# Patient Record
Sex: Female | Born: 1995 | Race: White | Hispanic: No | Marital: Single | Smoking: Current every day smoker
Health system: Southern US, Community
[De-identification: ages and names within clinical notes are randomized; demographics above are authoritative.]

## PROBLEM LIST (undated history)

## (undated) DIAGNOSIS — G43909 Migraine, unspecified, not intractable, without status migrainosus: Secondary | ICD-10-CM

## (undated) DIAGNOSIS — F329 Major depressive disorder, single episode, unspecified: Secondary | ICD-10-CM

## (undated) DIAGNOSIS — D849 Immunodeficiency, unspecified: Secondary | ICD-10-CM

## (undated) DIAGNOSIS — F32A Depression, unspecified: Secondary | ICD-10-CM

## (undated) DIAGNOSIS — F909 Attention-deficit hyperactivity disorder, unspecified type: Secondary | ICD-10-CM

## (undated) DIAGNOSIS — J45909 Unspecified asthma, uncomplicated: Secondary | ICD-10-CM

## (undated) HISTORY — DX: Depression, unspecified: F32.A

## (undated) HISTORY — PX: MYRINGOTOMY: SUR874

## (undated) HISTORY — DX: Immunodeficiency, unspecified: D84.9

## (undated) HISTORY — DX: Migraine, unspecified, not intractable, without status migrainosus: G43.909

## (undated) HISTORY — DX: Major depressive disorder, single episode, unspecified: F32.9

## (undated) HISTORY — DX: Attention-deficit hyperactivity disorder, unspecified type: F90.9

## (undated) HISTORY — DX: Unspecified asthma, uncomplicated: J45.909

---
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
## 2014-01-22 ENCOUNTER — Other Ambulatory Visit: Payer: Self-pay | Admitting: Physician Assistant

## 2014-01-23 NOTE — Telephone Encounter (Signed)
Rx request to pharmacy/SLS  

## 2014-02-21 ENCOUNTER — Telehealth: Payer: Self-pay | Admitting: Physician Assistant

## 2014-02-23 NOTE — Telephone Encounter (Signed)
Rx request to pharmacy/SLS Requested drug refills are authorized, however, the patient needs further evaluation and/or laboratory testing before further refills are given. Ask her to make an appointment for this.  Please call patient and arrange follow-up appointment per provide instructions/SLS Thanks.

## 2014-02-25 NOTE — Telephone Encounter (Signed)
lvm advising pt to call and schedule follow up.

## 2014-03-13 ENCOUNTER — Ambulatory Visit (INDEPENDENT_AMBULATORY_CARE_PROVIDER_SITE_OTHER): Payer: 59 | Admitting: Physician Assistant

## 2014-03-13 ENCOUNTER — Ambulatory Visit: Payer: Self-pay | Admitting: Physician Assistant

## 2014-03-13 ENCOUNTER — Encounter: Payer: Self-pay | Admitting: Physician Assistant

## 2014-03-13 VITALS — BP 102/82 | HR 111 | Temp 98.0°F | Resp 16 | Ht 61.0 in | Wt 158.2 lb

## 2014-03-13 DIAGNOSIS — R5382 Chronic fatigue, unspecified: Secondary | ICD-10-CM | POA: Insufficient documentation

## 2014-03-13 DIAGNOSIS — F418 Other specified anxiety disorders: Secondary | ICD-10-CM

## 2014-03-13 DIAGNOSIS — F419 Anxiety disorder, unspecified: Secondary | ICD-10-CM

## 2014-03-13 DIAGNOSIS — F909 Attention-deficit hyperactivity disorder, unspecified type: Secondary | ICD-10-CM

## 2014-03-13 DIAGNOSIS — F329 Major depressive disorder, single episode, unspecified: Secondary | ICD-10-CM

## 2014-03-13 DIAGNOSIS — F988 Other specified behavioral and emotional disorders with onset usually occurring in childhood and adolescence: Secondary | ICD-10-CM

## 2014-03-13 LAB — CBC
HEMATOCRIT: 42.4 % (ref 36.0–46.0)
HEMOGLOBIN: 14.1 g/dL (ref 12.0–15.0)
MCH: 29.4 pg (ref 26.0–34.0)
MCHC: 33.3 g/dL (ref 30.0–36.0)
MCV: 88.3 fL (ref 78.0–100.0)
MPV: 11.2 fL (ref 8.6–12.4)
Platelets: 295 10*3/uL (ref 150–400)
RBC: 4.8 MIL/uL (ref 3.87–5.11)
RDW: 13.2 % (ref 11.5–15.5)
WBC: 6.3 10*3/uL (ref 4.0–10.5)

## 2014-03-13 MED ORDER — LISDEXAMFETAMINE DIMESYLATE 20 MG PO CAPS: 20.0000 mg | ORAL_CAPSULE | Freq: Every day | ORAL | Status: DC

## 2014-03-13 MED ORDER — BUPROPION HCL ER (XL) 150 MG PO TB24: 450.0000 mg | ORAL_TABLET | Freq: Every day | ORAL | Status: DC

## 2014-03-13 NOTE — Assessment & Plan Note (Signed)
Well-controlled.  No changes. Medications refilled.

## 2014-03-13 NOTE — Assessment & Plan Note (Signed)
Will check CBC and TSH today.

## 2014-03-13 NOTE — Assessment & Plan Note (Signed)
Will start Vyvanse 20 mg daily.  CSC signed.  Follow-up 1 month.

## 2014-03-13 NOTE — Progress Notes (Signed)
   Patient presents to clinic today for medication management.  Patent with history of depression, well-controlled with Wellbutrin for many years.  Denies SI/HI.    Wishes to restart medication for her ADD.  Was previously on Vyvanse  30 mg daily.  Endorses good focus with this medication, but it made her feel jittery.  Past Medical History  Diagnosis Date  . Childhood asthma   . Migraines   . Depression   . Autoimmune disorder     Current Outpatient Prescriptions on File Prior to Visit  Medication Sig Dispense Refill  . loratadine (CLARITIN) 10 MG tablet Take 10 mg by mouth daily.    . Norgestimate-Ethinyl Estradiol Triphasic (TRINESSA, 28,) 0.18/0.215/0.25 MG-35 MCG tablet Take 1 tablet by mouth daily.    Marland Kitchen. topiramate (TOPAMAX) 100 MG tablet Take 100 mg by mouth daily.     No current facility-administered medications on file prior to visit.    No Known Allergies  Family History  Problem Relation Age of Onset  . Hyperlipidemia Other     parent  . Hypertension Mother   . Hypertension Father   . Depression Mother   . Depression Maternal Grandmother   . Depression Paternal Grandmother   . Diabetes Mother   . Arthritis Paternal Grandmother   . Breast cancer Maternal Grandmother   . Breast cancer Paternal Grandmother   . Lung cancer Maternal Grandmother   . Hyperlipidemia Other     grandparent  . Heart disease Other     grandparent  . Diabetes Maternal Grandmother   . Autoimmune disease Brother   . Asthma Brother     History   Social History  . Marital Status: Single    Spouse Name: N/A  . Number of Children: N/A  . Years of Education: N/A   Social History Main Topics  . Smoking status: Former Smoker -- 0.25 packs/day    Quit date: 07/09/2013  . Smokeless tobacco: Never Used  . Alcohol Use: Yes     Comment: rare  . Drug Use: Yes    Special: Marijuana  . Sexual Activity: Yes    Birth Control/ Protection: Pill     Comment: men   Other Topics Concern  .  None   Social History Narrative   Review of Systems - See HPI.  All other ROS are negative.  BP 102/82 mmHg  Pulse 111  Temp(Src) 98 F (36.7 C) (Oral)  Resp 16  Ht 5\' 1"  (1.549 m)  Wt 158 lb 4 oz (71.782 kg)  BMI 29.92 kg/m2  SpO2 96%  LMP 03/11/2014  Physical Exam  Constitutional: She is oriented to person, place, and time and well-developed, well-nourished, and in no distress.  HENT:  Head: Normocephalic and atraumatic.  Cardiovascular: Normal rate, regular rhythm, normal heart sounds and intact distal pulses.   Pulmonary/Chest: Effort normal and breath sounds normal. No respiratory distress. She has no wheezes. She has no rales. She exhibits no tenderness.  Neurological: She is alert and oriented to person, place, and time.  Skin: Skin is warm and dry. No rash noted.  Psychiatric: Affect normal.  Vitals reviewed.  Assessment/Plan: ADD (attention deficit disorder) Will start Vyvanse 20 mg daily.  CSC signed.  Follow-up 1 month.   Anxiety and depression Well-controlled.  No changes. Medications refilled.   Chronic fatigue Will check CBC and TSH today.

## 2014-03-13 NOTE — Progress Notes (Signed)
Pre visit review using our clinic review tool, if applicable. No additional management support is needed unless otherwise documented below in the visit note/SLS  

## 2014-03-14 LAB — TSH: TSH: 2.346 u[IU]/mL (ref 0.350–4.500)

## 2014-04-14 ENCOUNTER — Encounter: Payer: Self-pay | Admitting: Physician Assistant

## 2014-04-14 ENCOUNTER — Ambulatory Visit (INDEPENDENT_AMBULATORY_CARE_PROVIDER_SITE_OTHER): Payer: 59 | Admitting: Physician Assistant

## 2014-04-14 VITALS — BP 114/70 | HR 96 | Temp 98.9°F | Resp 16 | Ht 61.0 in | Wt 151.4 lb

## 2014-04-14 DIAGNOSIS — F909 Attention-deficit hyperactivity disorder, unspecified type: Secondary | ICD-10-CM | POA: Diagnosis not present

## 2014-04-14 DIAGNOSIS — F988 Other specified behavioral and emotional disorders with onset usually occurring in childhood and adolescence: Secondary | ICD-10-CM

## 2014-04-14 MED ORDER — LISDEXAMFETAMINE DIMESYLATE 30 MG PO CAPS: 30.0000 mg | ORAL_CAPSULE | Freq: Every day | ORAL | Status: DC

## 2014-04-14 MED ORDER — BUPROPION HCL ER (XL) 150 MG PO TB24: 450.0000 mg | ORAL_TABLET | Freq: Every day | ORAL | Status: AC

## 2014-04-14 NOTE — Progress Notes (Signed)
Pre visit review using our clinic review tool, if applicable. No additional management support is needed unless otherwise documented below in the visit note/SLS  

## 2014-04-14 NOTE — Patient Instructions (Signed)
Please take your medications as directed. We are increasing the Vyvanse to 30 mg daily.  Limit caffeine intake while on this medication.  Follow-up with me in 1 month to reassess your BP and heart rate while on medication.

## 2014-04-14 NOTE — Progress Notes (Signed)
Patient presents to clinic today for follow-up of ADD after restarting Vyvanse 20 mg daily.  Patient endorses taking medication as directed with good improvement in symptoms initially.  States that over the past couple of weeks her current dose has not been as effective for her.  Denies palpitations, insomnia or anorexia.   Past Medical History  Diagnosis Date  . Childhood asthma   . Migraines   . Depression   . Autoimmune disorder     Current Outpatient Prescriptions on File Prior to Visit  Medication Sig Dispense Refill  . loratadine (CLARITIN) 10 MG tablet Take 10 mg by mouth daily.    . Norgestimate-Ethinyl Estradiol Triphasic (TRINESSA, 28,) 0.18/0.215/0.25 MG-35 MCG tablet Take 1 tablet by mouth daily.    Marland Kitchen. topiramate (TOPAMAX) 100 MG tablet Take 100 mg by mouth daily.     No current facility-administered medications on file prior to visit.    No Known Allergies  Family History  Problem Relation Age of Onset  . Hyperlipidemia Other     parent  . Hypertension Mother   . Hypertension Father   . Depression Mother   . Depression Maternal Grandmother   . Depression Paternal Grandmother   . Diabetes Mother   . Arthritis Paternal Grandmother   . Breast cancer Maternal Grandmother   . Breast cancer Paternal Grandmother   . Lung cancer Maternal Grandmother   . Hyperlipidemia Other     grandparent  . Heart disease Other     grandparent  . Diabetes Maternal Grandmother   . Autoimmune disease Brother   . Asthma Brother     History   Social History  . Marital Status: Single    Spouse Name: N/A  . Number of Children: N/A  . Years of Education: N/A   Social History Main Topics  . Smoking status: Former Smoker -- 0.25 packs/day    Quit date: 07/09/2013  . Smokeless tobacco: Never Used  . Alcohol Use: Yes     Comment: rare  . Drug Use: Yes    Special: Marijuana  . Sexual Activity: Yes    Birth Control/ Protection: Pill     Comment: men   Other Topics Concern   . None   Social History Narrative   Review of Systems - See HPI.  All other ROS are negative.  BP 114/70 mmHg  Pulse 96  Temp(Src) 98.9 F (37.2 C) (Oral)  Resp 16  Ht 5\' 1"  (1.549 m)  Wt 151 lb 6 oz (68.663 kg)  BMI 28.62 kg/m2  SpO2 99%  LMP 04/06/2014  Physical Exam  Constitutional: She is oriented to person, place, and time and well-developed, well-nourished, and in no distress.  HENT:  Head: Normocephalic and atraumatic.  Eyes: Conjunctivae are normal.  Cardiovascular: Normal rate, regular rhythm, normal heart sounds and intact distal pulses.   Pulmonary/Chest: Effort normal and breath sounds normal. No respiratory distress. She has no wheezes. She has no rales. She exhibits no tenderness.  Neurological: She is alert and oriented to person, place, and time.  Vitals reviewed.   Recent Results (from the past 2160 hour(s))  TSH     Status: None   Collection Time: 03/13/14  3:53 PM  Result Value Ref Range   TSH 2.346 0.350 - 4.500 uIU/mL  CBC     Status: None   Collection Time: 03/13/14  3:53 PM  Result Value Ref Range   WBC 6.3 4.0 - 10.5 K/uL   RBC 4.80 3.87 - 5.11  MIL/uL   Hemoglobin 14.1 12.0 - 15.0 g/dL   HCT 28.4 13.2 - 44.0 %   MCV 88.3 78.0 - 100.0 fL   MCH 29.4 26.0 - 34.0 pg   MCHC 33.3 30.0 - 36.0 g/dL   RDW 10.2 72.5 - 36.6 %   Platelets 295 150 - 400 K/uL   MPV 11.2 8.6 - 12.4 fL    Assessment/Plan: ADD (attention deficit disorder) Will increase Vyvanse to 30 mg daily.  Follow-up in 1 month.

## 2014-04-15 NOTE — Assessment & Plan Note (Signed)
Will increase Vyvanse to 30 mg daily.  Follow-up in 1 month.

## 2014-05-13 ENCOUNTER — Encounter: Payer: Self-pay | Admitting: Physician Assistant

## 2014-05-13 ENCOUNTER — Ambulatory Visit (INDEPENDENT_AMBULATORY_CARE_PROVIDER_SITE_OTHER): Payer: 59 | Admitting: Physician Assistant

## 2014-05-13 VITALS — BP 115/70 | HR 90 | Temp 98.0°F | Wt 146.0 lb

## 2014-05-13 DIAGNOSIS — D803 Selective deficiency of immunoglobulin G [IgG] subclasses: Secondary | ICD-10-CM

## 2014-05-13 DIAGNOSIS — F909 Attention-deficit hyperactivity disorder, unspecified type: Secondary | ICD-10-CM | POA: Diagnosis not present

## 2014-05-13 DIAGNOSIS — Z23 Encounter for immunization: Secondary | ICD-10-CM | POA: Diagnosis not present

## 2014-05-13 DIAGNOSIS — F988 Other specified behavioral and emotional disorders with onset usually occurring in childhood and adolescence: Secondary | ICD-10-CM

## 2014-05-13 MED ORDER — LISDEXAMFETAMINE DIMESYLATE 30 MG PO CAPS: 30.0000 mg | ORAL_CAPSULE | Freq: Every day | ORAL | Status: DC

## 2014-05-13 NOTE — Assessment & Plan Note (Signed)
Pneumovax given per Immunology recommendations.

## 2014-05-13 NOTE — Assessment & Plan Note (Signed)
Doing very well on current regimen.  Vitals stable.  Will continue current dosing.  Refills given.  Follow-up in 6 months.

## 2014-05-13 NOTE — Progress Notes (Signed)
Patient presents to clinic today for follow-up of ADD after increasing Vyvanse to 30 mg daily.  Endorses marked improvement in focus and grades.  Denies anorexia, palpitations, insomnia.  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  Past Medical History  Diagnosis Date  . Childhood asthma   . Migraines   . Depression   . Autoimmune disorder     Current Outpatient Prescriptions on File Prior to Visit  Medication Sig Dispense Refill  . buPROPion (WELLBUTRIN XL) 150 MG 24 hr tablet Take 3 tablets (450 mg total) by mouth daily. 270 tablet 3  . loratadine (CLARITIN) 10 MG tablet Take 10 mg by mouth daily.    . Norgestimate-Ethinyl Estradiol Triphasic (TRINESSA, 28,) 0.18/0.215/0.25 MG-35 MCG tablet Take 1 tablet by mouth daily.    Marland Kitchen. topiramate (TOPAMAX) 100 MG tablet Take 100 mg by mouth daily.     No current facility-administered medications on file prior to visit.    No Known Allergies  Family History  Problem Relation Age of Onset  . Hyperlipidemia Other     parent  . Hypertension Mother   . Hypertension Father   . Depression Mother   . Depression Maternal Grandmother   . Depression Paternal Grandmother   . Diabetes Mother   . Arthritis Paternal Grandmother   . Breast cancer Maternal Grandmother   . Breast cancer Paternal Grandmother   . Lung cancer Maternal Grandmother   . Hyperlipidemia Other     grandparent  . Heart disease Other     grandparent  . Diabetes Maternal Grandmother   . Autoimmune disease Brother   . Asthma Brother     History   Social History  . Marital Status: Single    Spouse Name: N/A  . Number of Children: N/A  . Years of Education: N/A   Social History Main Topics  . Smoking status: Former Smoker -- 0.25 packs/day    Quit date: 07/09/2013  . Smokeless tobacco: Never Used  . Alcohol Use: Yes     Comment: rare  . Drug Use: Yes    Special: Marijuana  . Sexual Activity: Yes    Birth Control/  Protection: Pill     Comment: men   Other Topics Concern  . None   Social History Narrative   Review of Systems - See HPI.  All other ROS are negative.  BP 115/70 mmHg  Pulse 90  Temp(Src) 98 F (36.7 C)  Wt 146 lb (66.225 kg)  SpO2 100%  LMP 04/06/2014  Physical Exam  Constitutional: She is oriented to person, place, and time and well-developed, well-nourished, and in no distress.  HENT:  Head: Normocephalic and atraumatic.  Cardiovascular: Normal rate, regular rhythm, normal heart sounds and intact distal pulses.   Pulmonary/Chest: Effort normal and breath sounds normal. No respiratory distress. She has no wheezes. She has no rales. She exhibits no tenderness.  Neurological: She is alert and oriented to person, place, and time.  Skin: Skin is warm and dry. No rash noted.  Psychiatric: Affect normal.  Vitals reviewed.   Recent Results (from the past 2160 hour(s))  TSH     Status: None   Collection Time: 03/13/14  3:53 PM  Result Value Ref Range   TSH 2.346 0.350 - 4.500 uIU/mL  CBC     Status: None   Collection Time: 03/13/14  3:53 PM  Result Value Ref Range   WBC 6.3 4.0 - 10.5 K/uL   RBC 4.80 3.87 -  5.11 MIL/uL   Hemoglobin 14.1 12.0 - 15.0 g/dL   HCT 29.542.4 62.136.0 - 30.846.0 %   MCV 88.3 78.0 - 100.0 fL   MCH 29.4 26.0 - 34.0 pg   MCHC 33.3 30.0 - 36.0 g/dL   RDW 65.713.2 84.611.5 - 96.215.5 %   Platelets 295 150 - 400 K/uL   MPV 11.2 8.6 - 12.4 fL    Assessment/Plan: ADD (attention deficit disorder) Doing very well on current regimen.  Vitals stable.  Will continue current dosing.  Refills given.  Follow-up in 6 months.   Need for pneumococcal vaccination Pneumovax given per Immunology recommendations.

## 2014-05-19 ENCOUNTER — Ambulatory Visit: Payer: 59

## 2014-05-27 ENCOUNTER — Encounter: Payer: Self-pay | Admitting: Physician Assistant

## 2014-05-27 ENCOUNTER — Ambulatory Visit (INDEPENDENT_AMBULATORY_CARE_PROVIDER_SITE_OTHER): Payer: 59 | Admitting: Physician Assistant

## 2014-05-27 VITALS — BP 100/82 | HR 112 | Temp 98.1°F | Resp 16 | Wt 147.2 lb

## 2014-05-27 DIAGNOSIS — J019 Acute sinusitis, unspecified: Secondary | ICD-10-CM | POA: Diagnosis not present

## 2014-05-27 DIAGNOSIS — B9689 Other specified bacterial agents as the cause of diseases classified elsewhere: Secondary | ICD-10-CM

## 2014-05-27 MED ORDER — AMOXICILLIN-POT CLAVULANATE 875-125 MG PO TABS
1.0000 | ORAL_TABLET | Freq: Two times a day (BID) | ORAL | Status: DC
Start: 2014-05-27 — End: 2014-07-20

## 2014-05-27 NOTE — Progress Notes (Signed)
Patient presents to clinic today c/o sore throat, nasal congestion, sinus pressure, productive cough and facial pain since this past weekend that is rapidly progressing.  Endorses ear pain and tooth pain.  Denies fever, chills, nausea, chest pain or SOB.  Denies recent travel or sick contact.  Past Medical History  Diagnosis Date  . Childhood asthma   . Migraines   . Depression   . Autoimmune disorder     Current Outpatient Prescriptions on File Prior to Visit  Medication Sig Dispense Refill  . buPROPion (WELLBUTRIN XL) 150 MG 24 hr tablet Take 3 tablets (450 mg total) by mouth daily. 270 tablet 3  . lisdexamfetamine (VYVANSE) 30 MG capsule Take 1 capsule (30 mg total) by mouth daily. 30 capsule 0  . loratadine (CLARITIN) 10 MG tablet Take 10 mg by mouth daily.    . Norgestimate-Ethinyl Estradiol Triphasic (TRINESSA, 28,) 0.18/0.215/0.25 MG-35 MCG tablet Take 1 tablet by mouth daily.    Marland Kitchen. topiramate (TOPAMAX) 100 MG tablet Take 100 mg by mouth daily.     No current facility-administered medications on file prior to visit.    No Known Allergies  Family History  Problem Relation Age of Onset  . Hyperlipidemia Other     parent  . Hypertension Mother   . Hypertension Father   . Depression Mother   . Depression Maternal Grandmother   . Depression Paternal Grandmother   . Diabetes Mother   . Arthritis Paternal Grandmother   . Breast cancer Maternal Grandmother   . Breast cancer Paternal Grandmother   . Lung cancer Maternal Grandmother   . Hyperlipidemia Other     grandparent  . Heart disease Other     grandparent  . Diabetes Maternal Grandmother   . Autoimmune disease Brother   . Asthma Brother     History   Social History  . Marital Status: Single    Spouse Name: N/A  . Number of Children: N/A  . Years of Education: N/A   Social History Main Topics  . Smoking status: Former Smoker -- 0.25 packs/day    Quit date: 07/09/2013  . Smokeless tobacco: Never Used  .  Alcohol Use: Yes     Comment: rare  . Drug Use: Yes    Special: Marijuana  . Sexual Activity: Yes    Birth Control/ Protection: Pill     Comment: men   Other Topics Concern  . None   Social History Narrative    Review of Systems - See HPI.  All other ROS are negative.  BP 100/82 mmHg  Pulse 112  Temp(Src) 98.1 F (36.7 C) (Oral)  Resp 16  Wt 147 lb 3.2 oz (66.769 kg)  SpO2 99%  Physical Exam  Constitutional: She is oriented to person, place, and time and well-developed, well-nourished, and in no distress.  HENT:  Head: Normocephalic and atraumatic.  Right Ear: Tympanic membrane, external ear and ear canal normal.  Left Ear: Tympanic membrane, external ear and ear canal normal.  Nose: Mucosal edema and rhinorrhea present. Right sinus exhibits maxillary sinus tenderness and frontal sinus tenderness. Left sinus exhibits maxillary sinus tenderness and frontal sinus tenderness.  Mouth/Throat: Uvula is midline and mucous membranes are normal. Posterior oropharyngeal erythema present. No oropharyngeal exudate, posterior oropharyngeal edema or tonsillar abscesses.  Eyes: Conjunctivae are normal.  Neck: Neck supple.  Cardiovascular: Regular rhythm, normal heart sounds and intact distal pulses.   Mild tachycardia  Pulmonary/Chest: Effort normal and breath sounds normal. No respiratory distress. She  has no wheezes. She has no rales. She exhibits no tenderness.  Neurological: She is alert and oriented to person, place, and time.  Skin: Skin is warm and dry. No rash noted.  Psychiatric: Affect normal.  Vitals reviewed.   Recent Results (from the past 2160 hour(s))  TSH     Status: None   Collection Time: 03/13/14  3:53 PM  Result Value Ref Range   TSH 2.346 0.350 - 4.500 uIU/mL  CBC     Status: None   Collection Time: 03/13/14  3:53 PM  Result Value Ref Range   WBC 6.3 4.0 - 10.5 K/uL   RBC 4.80 3.87 - 5.11 MIL/uL   Hemoglobin 14.1 12.0 - 15.0 g/dL   HCT 98.142.4 19.136.0 - 47.846.0 %    MCV 88.3 78.0 - 100.0 fL   MCH 29.4 26.0 - 34.0 pg   MCHC 33.3 30.0 - 36.0 g/dL   RDW 29.513.2 62.111.5 - 30.815.5 %   Platelets 295 150 - 400 K/uL   MPV 11.2 8.6 - 12.4 fL    Assessment/Plan: No problem-specific assessment & plan notes found for this encounter.

## 2014-05-27 NOTE — Progress Notes (Signed)
Pre visit review using our clinic review tool, if applicable. No additional management support is needed unless otherwise documented below in the visit note. 

## 2014-05-27 NOTE — Patient Instructions (Signed)
Please take antibiotic as directed.  Increase fluid intake.  Use Saline nasal spray.  Take a daily multivitamin. Use Mucinex for congestion and cough.  Place a humidifier in the bedroom.  Please call or return clinic if symptoms are not improving.  Sinusitis Sinusitis is redness, soreness, and swelling (inflammation) of the paranasal sinuses. Paranasal sinuses are air pockets within the bones of your face (beneath the eyes, the middle of the forehead, or above the eyes). In healthy paranasal sinuses, mucus is able to drain out, and air is able to circulate through them by way of your nose. However, when your paranasal sinuses are inflamed, mucus and air can become trapped. This can allow bacteria and other germs to grow and cause infection. Sinusitis can develop quickly and last only a short time (acute) or continue over a long period (chronic). Sinusitis that lasts for more than 12 weeks is considered chronic.  CAUSES  Causes of sinusitis include:  Allergies.  Structural abnormalities, such as displacement of the cartilage that separates your nostrils (deviated septum), which can decrease the air flow through your nose and sinuses and affect sinus drainage.  Functional abnormalities, such as when the small hairs (cilia) that line your sinuses and help remove mucus do not work properly or are not present. SYMPTOMS  Symptoms of acute and chronic sinusitis are the same. The primary symptoms are pain and pressure around the affected sinuses. Other symptoms include:  Upper toothache.  Earache.  Headache.  Bad breath.  Decreased sense of smell and taste.  A cough, which worsens when you are lying flat.  Fatigue.  Fever.  Thick drainage from your nose, which often is green and may contain pus (purulent).  Swelling and warmth over the affected sinuses. DIAGNOSIS  Your caregiver will perform a physical exam. During the exam, your caregiver may:  Look in your nose for signs of abnormal  growths in your nostrils (nasal polyps).  Tap over the affected sinus to check for signs of infection.  View the inside of your sinuses (endoscopy) with a special imaging device with a light attached (endoscope), which is inserted into your sinuses. If your caregiver suspects that you have chronic sinusitis, one or more of the following tests may be recommended:  Allergy tests.  Nasal culture A sample of mucus is taken from your nose and sent to a lab and screened for bacteria.  Nasal cytology A sample of mucus is taken from your nose and examined by your caregiver to determine if your sinusitis is related to an allergy. TREATMENT  Most cases of acute sinusitis are related to a viral infection and will resolve on their own within 10 days. Sometimes medicines are prescribed to help relieve symptoms (pain medicine, decongestants, nasal steroid sprays, or saline sprays).  However, for sinusitis related to a bacterial infection, your caregiver will prescribe antibiotic medicines. These are medicines that will help kill the bacteria causing the infection.  Rarely, sinusitis is caused by a fungal infection. In theses cases, your caregiver will prescribe antifungal medicine. For some cases of chronic sinusitis, surgery is needed. Generally, these are cases in which sinusitis recurs more than 3 times per year, despite other treatments. HOME CARE INSTRUCTIONS   Drink plenty of water. Water helps thin the mucus so your sinuses can drain more easily.  Use a humidifier.  Inhale steam 3 to 4 times a day (for example, sit in the bathroom with the shower running).  Apply a warm, moist washcloth to your face  3 to 4 times a day, or as directed by your caregiver.  Use saline nasal sprays to help moisten and clean your sinuses.  Take over-the-counter or prescription medicines for pain, discomfort, or fever only as directed by your caregiver. SEEK IMMEDIATE MEDICAL CARE IF:  You have increasing pain or  severe headaches.  You have nausea, vomiting, or drowsiness.  You have swelling around your face.  You have vision problems.  You have a stiff neck.  You have difficulty breathing. MAKE SURE YOU:   Understand these instructions.  Will watch your condition.  Will get help right away if you are not doing well or get worse. Document Released: 12/26/2004 Document Revised: 03/20/2011 Document Reviewed: 01/10/2011 Exit[employer] Patient Information 2014 [employer].   

## 2014-07-20 ENCOUNTER — Ambulatory Visit (INDEPENDENT_AMBULATORY_CARE_PROVIDER_SITE_OTHER): Payer: 59 | Admitting: Physician Assistant

## 2014-07-20 ENCOUNTER — Encounter: Payer: Self-pay | Admitting: Physician Assistant

## 2014-07-20 VITALS — BP 108/74 | HR 109 | Temp 98.1°F | Ht 61.0 in | Wt 144.4 lb

## 2014-07-20 DIAGNOSIS — J209 Acute bronchitis, unspecified: Secondary | ICD-10-CM | POA: Diagnosis not present

## 2014-07-20 MED ORDER — CLARITHROMYCIN 250 MG PO TABS: 250.0000 mg | ORAL_TABLET | Freq: Two times a day (BID) | ORAL | Status: DC

## 2014-07-20 MED ORDER — BENZONATATE 200 MG PO CAPS: 200.0000 mg | ORAL_CAPSULE | Freq: Two times a day (BID) | ORAL | Status: DC | PRN

## 2014-07-20 NOTE — Progress Notes (Signed)
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 Denies fever, chills, SOB or chest pain. Denies recent travel or sick contact.  Past Medical History  Diagnosis Date  . Childhood asthma   . Migraines   . Depression   . Autoimmune disorder     Current Outpatient Prescriptions on File Prior to Visit  Medication Sig Dispense Refill  . buPROPion (WELLBUTRIN XL) 150 MG 24 hr tablet Take 3 tablets (450 mg total) by mouth daily. 270 tablet 3  . lisdexamfetamine (VYVANSE) 30 MG capsule Take 1 capsule (30 mg total) by mouth daily. 30 capsule 0  . Norgestimate-Ethinyl Estradiol Triphasic (TRINESSA, 28,) 0.18/0.215/0.25 MG-35 MCG tablet Take 1 tablet by mouth daily.    Marland Kitchen topiramate (TOPAMAX) 100 MG tablet Take 100 mg by mouth daily.     No current facility-administered medications on file prior to visit.    No Known Allergies  Family History  Problem Relation Age of Onset  . Hyperlipidemia Other     parent  . Hypertension Mother   . Hypertension Father   . Depression Mother   . Depression Maternal Grandmother   . Depression Paternal Grandmother   . Diabetes Mother   . Arthritis Paternal Grandmother   . Breast cancer Maternal Grandmother   . Breast cancer Paternal Grandmother   . Lung cancer Maternal Grandmother   . Hyperlipidemia Other     grandparent  . Heart disease Other     grandparent  . Diabetes Maternal Grandmother   . Autoimmune disease Brother   . Asthma Brother     History   Social History  . Marital Status: Single    Spouse Name: N/A  . Number of Children: N/A  . Years of Education: N/A   Social History Main Topics  . Smoking status: Former Smoker -- 0.25 packs/day    Quit date: 07/09/2013  . Smokeless tobacco: Never Used  . Alcohol Use: Yes     Comment: rare  . Drug Use: Yes    Special: Marijuana  . Sexual Activity: Yes    Birth Control/ Protection: Pill     Comment: men   Other Topics  Concern  . None   Social History Narrative   Review of Systems - See HPI.  All other ROS are negative.  BP 108/74 mmHg  Pulse 109  Temp(Src) 98.1 F (36.7 C) (Oral)  Ht  (1.549 m)  Wt 144 lb 6.4 oz (65.499 kg)  BMI 27.30 kg/m2  SpO2 99%  LMP 07/06/2014  Physical Exam  Constitutional: She is oriented to person, place, and time and well-developed, well-nourished, and in no distress.  HENT:  Head: Normocephalic and atraumatic.  Right Ear: External ear normal.  Left Ear: External ear normal.  Nose: Nose normal.  Mouth/Throat: Oropharynx is clear and moist. No oropharyngeal exudate.  TM within normal limits bilaterally.  Eyes: Conjunctivae are normal.  Neck: Neck supple.  Cardiovascular: Normal rate, regular rhythm, normal heart sounds and intact distal pulses.   Pulmonary/Chest: Effort normal and breath sounds normal. No respiratory distress. She has no wheezes. She has no rales. She exhibits no tenderness.  Neurological: She is alert and oriented to person, place, and time.  Skin: Skin is warm and dry. No rash noted.  Psychiatric: Affect normal.  Vitals reviewed.    Assessment/Plan: Acute bronchitis Likely viral although have to be a little more proactive giving IgG deficiency. Supportive measures reviewed -- increased hydration,  rest, saline nasal spray. Mucinex. Rx Tessalon for cough. Will Rx Biaxin to take if symptoms worsening over next 48 hours. Follow-up if symptoms are not resolving.

## 2014-07-20 NOTE — Assessment & Plan Note (Signed)
Likely viral although have to be a little more proactive giving IgG deficiency. Supportive measures reviewed -- increased hydration, rest, saline nasal spray. Mucinex. Rx Tessalon for cough. Will Rx Biaxin to take if symptoms worsening over next 48 hours. Follow-up if symptoms are not resolving.

## 2014-07-20 NOTE — Progress Notes (Signed)
Pre visit review using our clinic review tool, if applicable. No additional management support is needed unless otherwise documented below in the visit note. 

## 2014-07-20 NOTE — Patient Instructions (Signed)
Please stay well hydrated and get plenty of rest.  Use Mucinex to help break up congestion.  Take Tessalon as directed for cough. Continue medications as directed but start a Zantac twice daily instead of the Xyzal. If symptoms are not improving over the next 48 hours, please take antibiotic as directed.

## 2014-08-24 ENCOUNTER — Telehealth: Payer: Self-pay | Admitting: Physician Assistant

## 2014-08-24 DIAGNOSIS — F988 Other specified behavioral and emotional disorders with onset usually occurring in childhood and adolescence: Secondary | ICD-10-CM

## 2014-08-24 MED ORDER — LISDEXAMFETAMINE DIMESYLATE 30 MG PO CAPS: 30.0000 mg | ORAL_CAPSULE | Freq: Every day | ORAL | Status: DC

## 2014-08-24 NOTE — Telephone Encounter (Signed)
Caller name: Mitzie Relationship to patient: self Can be reached: [phone]  Reason for call: Pt called for refill on Vyvanse. Has 3 days left. Takes 1/day. Please call when ready.

## 2014-08-24 NOTE — Telephone Encounter (Signed)
Referral granted. Rx at front desk for pickup. 13-month supply as need to give UDS before additional refills.

## 2014-08-24 NOTE — Telephone Encounter (Signed)
Requesting Vyvanse -Take 1 capsule by mouth daily. Last refill:05/13/14;#30,0 Last OV:07/20/14 UDS;No urine given Please advise.//AB/CMA

## 2014-09-25 ENCOUNTER — Ambulatory Visit: Payer: 59 | Admitting: Physician Assistant

## 2014-09-25 ENCOUNTER — Telehealth: Payer: Self-pay | Admitting: Physician Assistant

## 2014-09-28 ENCOUNTER — Encounter: Payer: Self-pay | Admitting: Family Medicine

## 2014-09-28 ENCOUNTER — Ambulatory Visit (INDEPENDENT_AMBULATORY_CARE_PROVIDER_SITE_OTHER): Payer: 59 | Admitting: Family Medicine

## 2014-09-28 VITALS — BP 119/83 | HR 86 | Temp 98.2°F | Resp 16 | Ht 61.0 in | Wt 135.0 lb

## 2014-09-28 DIAGNOSIS — Z23 Encounter for immunization: Secondary | ICD-10-CM | POA: Diagnosis not present

## 2014-09-28 DIAGNOSIS — D849 Immunodeficiency, unspecified: Secondary | ICD-10-CM | POA: Diagnosis not present

## 2014-09-28 DIAGNOSIS — J018 Other acute sinusitis: Secondary | ICD-10-CM | POA: Diagnosis not present

## 2014-09-28 MED ORDER — AMOXICILLIN-POT CLAVULANATE 875-125 MG PO TABS: 1.0000 | ORAL_TABLET | Freq: Two times a day (BID) | ORAL | Status: DC

## 2014-09-28 NOTE — Progress Notes (Signed)
OFFICE VISIT  09/28/2014   CC:  Chief Complaint  Patient presents with  . URI    x 1 week   HPI:    Patient is a 19 y.o. Caucasian female who presents for about 1 week of nasal congestion/runny nose, PND, pressure in face/orbital areas, no fevers.  Says mucous is thin, bright green.  Diffuse upper teeth pain noted lately. Feels a bit SOB/chest tightness.  She has remote hx of asthma, does not have an inhaler.  Past Medical History  Diagnosis Date  . Childhood asthma   . Migraines   . Depression   . Immune deficiency disorder     pt not with much info avail, says brother has same defect but is worse and has to have IgG infusions    Past Surgical History  Procedure Laterality Date  . Adenoidectomy  1997  . Wisdom tooth extraction  2014   MEDS: pt not taking tessalon, clarithromycin, flonase, listed below Outpatient Prescriptions Prior to Visit  Medication Sig Dispense Refill  . buPROPion (WELLBUTRIN XL) 150 MG 24 hr tablet Take 3 tablets (450 mg total) by mouth daily. 270 tablet 3  . levocetirizine (XYZAL) 5 MG tablet Take 1 tablet by mouth daily.  6  . lisdexamfetamine (VYVANSE) 30 MG capsule Take 1 capsule (30 mg total) by mouth daily. 30 capsule 0  . montelukast (SINGULAIR) 10 MG tablet Take 1 tablet by mouth daily.  6  . topiramate (TOPAMAX) 100 MG tablet Take 100 mg by mouth daily.    . fluticasone (FLONASE) 50 MCG/ACT nasal spray SHAKE WELL AND U 1 TO 2 SPRAYS IEN QD  6  . Norgestimate-Ethinyl Estradiol Triphasic (TRINESSA, 28,) 0.18/0.215/0.25 MG-35 MCG tablet Take 1 tablet by mouth daily.    . benzonatate (TESSALON) 200 MG capsule Take 1 capsule (200 mg total) by mouth 2 (two) times daily as needed for cough. (Patient not taking: Reported on 09/28/2014) 20 capsule 0  . clarithromycin (BIAXIN) 250 MG tablet Take 1 tablet (250 mg total) by mouth 2 (two) times daily. (Patient not taking: Reported on 09/28/2014) 14 tablet 0   No facility-administered medications prior to  visit.    No Known Allergies  ROS As per HPI  PE: Blood pressure 119/83, pulse 86, temperature 98.2 F (36.8 C), temperature source Oral, resp. rate 16, height  (1.549 m), weight 135 lb (61.236 kg), last menstrual period 08/24/2014, SpO2 99 %. VS: noted--normal. Gen: alert, NAD, NONTOXIC APPEARING. HEENT: eyes without injection, drainage, or swelling.  Ears: EACs clear, TMs with normal light reflex and landmarks.  Nose: Clear rhinorrhea, with some dried, crusty exudate adherent to mildly injected mucosa.  No purulent d/c.  Mild diffuse, R>L paranasal sinus TTP.  No facial swelling.  Throat and mouth without focal lesion.  No pharyngial swelling, erythema, or exudate.   Neck: supple, no LAD.   LUNGS: CTA bilat, nonlabored resps.   CV: RRR, no m/r/g. EXT: no c/c/e SKIN: no rash  LABS:  none  IMPRESSION AND PLAN:  Acute sinusitis, possibly bacterial. Pt with nondescript (no old records/pt with little info) immune deficiency, presumably placing her at higher risk of both viral and bacterial infections. Augmentin  bid x 10d rx'd today. Continue singulair and xyzal.  She'll restart her flonase, add OTC saline nasal spray and sudafed prn. Flu vaccine ("regular dose") given today.  An After Visit Summary was printed and given to the patient.  FOLLOW UP: Return if symptoms worsen or fail to improve.

## 2014-09-28 NOTE — Progress Notes (Signed)
Pre visit review using our clinic review tool, if applicable. No additional management support is needed unless otherwise documented below in the visit note. 

## 2014-09-28 NOTE — Patient Instructions (Signed)
Restart your flonase nasal spray.  Buy generic OTC saline nasal spray and use 2-3 sprays each nostril and blow--to irrigate nasal passages 2-3 times per day.

## 2014-10-05 ENCOUNTER — Telehealth: Payer: Self-pay | Admitting: Physician Assistant

## 2014-10-05 DIAGNOSIS — F988 Other specified behavioral and emotional disorders with onset usually occurring in childhood and adolescence: Secondary | ICD-10-CM

## 2014-10-05 MED ORDER — LISDEXAMFETAMINE DIMESYLATE 30 MG PO CAPS: 30.0000 mg | ORAL_CAPSULE | Freq: Every day | ORAL | Status: DC

## 2014-10-05 NOTE — Telephone Encounter (Signed)
Patient informed, understood & agreed; will p/u during regular business hours/SLS

## 2014-10-05 NOTE — Telephone Encounter (Signed)
Relation to ZO:XWRU Call back number: [phone] Pharmacy:  [id number] - [place], [place] - [address] AT [facility] [address] [phone] (Phone) [phone] (Fax         Reason for call:  Patient requesting a refill lisdexamfetamine (VYVANSE) 30 MG capsule

## 2014-10-07 NOTE — Telephone Encounter (Signed)
No charge this time. 

## 2014-10-07 NOTE — Telephone Encounter (Signed)
Pt was no show 09/25/14 4:00pm acute appt, pt came in 09/28/14 for same issue, charge for no show?

## 2014-10-22 ENCOUNTER — Ambulatory Visit (INDEPENDENT_AMBULATORY_CARE_PROVIDER_SITE_OTHER): Payer: 59 | Admitting: Medical

## 2014-10-22 ENCOUNTER — Encounter: Payer: Self-pay | Admitting: Medical

## 2014-10-22 VITALS — BP 114/79 | HR 104 | Temp 99.7°F | Ht 61.0 in | Wt 135.0 lb

## 2014-10-22 DIAGNOSIS — H9202 Otalgia, left ear: Secondary | ICD-10-CM | POA: Diagnosis not present

## 2014-10-22 DIAGNOSIS — J0101 Acute recurrent maxillary sinusitis: Secondary | ICD-10-CM

## 2014-10-22 DIAGNOSIS — H6122 Impacted cerumen, left ear: Secondary | ICD-10-CM | POA: Diagnosis not present

## 2014-10-22 NOTE — Patient Instructions (Signed)
You appear to have a sinus infection. I am prescribing  augmenitin antibiotic for the infection. To help with the nasal congestion I prescribed  flonase nasal steroid. For your associated cough, I prescribed cough medicine benzonatate.  For your cerumen impaction. Can start debrox in 5 days. Then we could try to lavage wax out in 10 days.  Rest, hydrate, tylenol for fever.  Follow up in 10 days or as needed.

## 2014-10-22 NOTE — Progress Notes (Signed)
Subjective:    Patient ID: Yolanda Phelps, female    DOB: 01-Mar-1995, 19 y.o.   MRN: [id number]  HPI   Pt in feeling sick since Sunday. Pt states first day st and coughing up some mucous. Bright green mucous. Pt does have some sinus pressure. No sneezing or itchy eyes. Pt does have hx of sinus infections. More chronic when younger.  Pt describes sinus pressure worst of all her symptoms.  Pt states immune deficiency disorder. I reviewed other problems list. Did not see much detail. Will see [facility] in December for follow up.  LMP- one week ago.   Review of Systems  Constitutional: Positive for fever, chills, diaphoresis and fatigue.       T max 100 other day.   HENT: Positive for congestion, ear pain and sinus pressure. Negative for mouth sores, nosebleeds and sneezing.        Lt ear pain.  Respiratory: Positive for cough.   Cardiovascular: Negative for chest pain and palpitations.  Gastrointestinal: Negative for abdominal pain.  Musculoskeletal: Negative for back pain.  Neurological: Negative for dizziness and headaches.  Hematological: Negative for adenopathy. Does not bruise/bleed easily.  Psychiatric/Behavioral: Negative for behavioral problems and confusion.    Past Medical History  Diagnosis Date  . Childhood asthma   . Migraines   . Depression   . Immune deficiency disorder (HCC)     pt not with much info avail, says brother has same defect but is worse and has to have IgG infusions    Social History   Social History  . Marital Status: Single    Spouse Name: N/A  . Number of Children: N/A  . Years of Education: N/A   Occupational History  . Not on file.   Social History Main Topics  . Smoking status: Former Smoker -- 0.25 packs/day    Quit date: 07/09/2013  . Smokeless tobacco: Never Used  . Alcohol Use: Yes     Comment: rare  . Drug Use: Yes    Special: Marijuana  . Sexual Activity: Yes    Birth Control/ Protection: Pill     Comment: men   Other  Topics Concern  . Not on file   Social History Narrative    Past Surgical History  Procedure Laterality Date  . Adenoidectomy  1997  . Wisdom tooth extraction  2014    Family History  Problem Relation Age of Onset  . Hyperlipidemia Other     parent  . Hypertension Mother   . Hypertension Father   . Depression Mother   . Depression Maternal Grandmother   . Depression Paternal Grandmother   . Diabetes Mother   . Arthritis Paternal Grandmother   . Breast cancer Maternal Grandmother   . Breast cancer Paternal Grandmother   . Lung cancer Maternal Grandmother   . Hyperlipidemia Other     grandparent  . Heart disease Other     grandparent  . Diabetes Maternal Grandmother   . Autoimmune disease Brother   . Asthma Brother     No Known Allergies  [facility] Outpatient Prescriptions on File Prior to Visit  Medication Sig Dispense Refill  . buPROPion (WELLBUTRIN XL) 150 MG 24 hr tablet Take 3 tablets (450 mg total) by mouth daily. 270 tablet 3  . fluticasone (FLONASE) 50 MCG/ACT nasal spray SHAKE WELL AND U 1 TO 2 SPRAYS IEN QD  6  . levocetirizine (XYZAL) 5 MG tablet Take 1 tablet by mouth daily.  6  .  lisdexamfetamine (VYVANSE) 30 MG capsule Take 1 capsule (30 mg total) by mouth daily. 30 capsule 0  . montelukast (SINGULAIR) 10 MG tablet Take 1 tablet by mouth daily.  6  . norethindrone-ethinyl estradiol (MICROGESTIN,JUNEL,LOESTRIN) 1-20 MG-MCG tablet Take by mouth.    . topiramate (TOPAMAX) 100 MG tablet Take 100 mg by mouth daily.    . Norgestimate-Ethinyl Estradiol Triphasic (TRINESSA, 28,) 0.18/0.215/0.25 MG-35 MCG tablet Take 1 tablet by mouth daily.     No Phelps facility-administered medications on file prior to visit.    BP 114/79 mmHg  Pulse 104  Temp(Src) 99.7 F (37.6 C)  Ht  (1.549 m)  Wt 135 lb (61.236 kg)  BMI 25.52 kg/m2  SpO2 98%  LMP 10/15/2014       Objective:   Physical Exam  General  Mental Status - Alert. General Appearance - Well  groomed. Not in acute distress.  Skin Rashes- No Rashes.  HEENT Head- Normal. Ear Auditory Canal - Left- Normal. Right - Normal.Tympanic Membrane- Left- wax obstructing view of tm.. Right- Normal. Eye Sclera/Conjunctiva- Left- Normal. Right- Normal. Nose & Sinuses Nasal Mucosa- Left-  Boggy and Congested. Right-  Boggy and  Congested.Bilateral maxillary and frontal sinus pressure. Mouth & Throat Lips: Upper Lip- Normal: no dryness, cracking, pallor, cyanosis, or vesicular eruption. Lower Lip-Normal: no dryness, cracking, pallor, cyanosis or vesicular eruption. Buccal Mucosa- Bilateral- No Aphthous ulcers. Oropharynx- No Discharge or Erythema. Tonsils: Characteristics- Bilateral- No Erythema or Congestion. Size/Enlargement- Bilateral- No enlargement. Discharge- bilateral-None.  Neck Neck- Supple. No Masses.   Chest and Lung Exam Auscultation: Breath Sounds:-Clear even and unlabored.  Cardiovascular Auscultation:Rythm- Regular, rate and rhythm. Murmurs & Other Heart Sounds:Ausculatation of the heart reveal- No Murmurs.  Lymphatic Head & Neck General Head & Neck Lymphatics: Bilateral: Description- No Localized lymphadenopathy.       Assessment & Plan:  You appear to have a sinus infection. I am prescribing  augmenitin antibiotic for the infection. To help with the nasal congestion I prescribed  flonase nasal steroid. For your associated cough, I prescribed cough medicine benzonatate.  For your cerumen impaction. Can start debrox in 5 days. Then we could try to lavage wax out in 10 days.  Rest, hydrate, tylenol for fever.  Follow up in 10 days or as needed.

## 2014-10-22 NOTE — Progress Notes (Signed)
Pre visit review using our clinic review tool, if applicable. No additional management support is needed unless otherwise documented below in the visit note. 

## 2014-10-23 ENCOUNTER — Telehealth: Payer: Self-pay | Admitting: Medical

## 2014-10-23 MED ORDER — AMOXICILLIN-POT CLAVULANATE 875-125 MG PO TABS
1.0000 | ORAL_TABLET | Freq: Two times a day (BID) | ORAL | Status: DC
Start: 2014-10-23 — End: 2014-11-13

## 2014-10-23 NOTE — Telephone Encounter (Signed)
Pt said pharmacy did not receive RX from yesterday. Please send to [phone] - [place], [place] - [address] AT [address].

## 2014-10-23 NOTE — Telephone Encounter (Signed)
Spoke with pt and she voices understanding.  

## 2014-11-13 ENCOUNTER — Encounter: Payer: Self-pay | Admitting: Physician Assistant

## 2014-11-13 ENCOUNTER — Ambulatory Visit (INDEPENDENT_AMBULATORY_CARE_PROVIDER_SITE_OTHER): Payer: 59 | Admitting: Physician Assistant

## 2014-11-13 VITALS — BP 114/76 | HR 81 | Temp 98.2°F | Resp 16 | Ht 61.0 in | Wt 136.0 lb

## 2014-11-13 DIAGNOSIS — F909 Attention-deficit hyperactivity disorder, unspecified type: Secondary | ICD-10-CM

## 2014-11-13 DIAGNOSIS — F329 Major depressive disorder, single episode, unspecified: Secondary | ICD-10-CM

## 2014-11-13 DIAGNOSIS — F32A Depression, unspecified: Secondary | ICD-10-CM

## 2014-11-13 DIAGNOSIS — F418 Other specified anxiety disorders: Secondary | ICD-10-CM

## 2014-11-13 DIAGNOSIS — F988 Other specified behavioral and emotional disorders with onset usually occurring in childhood and adolescence: Secondary | ICD-10-CM

## 2014-11-13 DIAGNOSIS — F419 Anxiety disorder, unspecified: Secondary | ICD-10-CM

## 2014-11-13 MED ORDER — LISDEXAMFETAMINE DIMESYLATE 40 MG PO CAPS: 40.0000 mg | ORAL_CAPSULE | ORAL | Status: DC

## 2014-11-13 NOTE — Assessment & Plan Note (Signed)
Doing very well. Will continue current regimen. 

## 2014-11-13 NOTE — Progress Notes (Signed)
Patient presents to clinic today for follow-up of anxiety/depression and ADD.  Is taking her Wellbutrin daily as directed and is doing well overall with mood. Denies anxiety or SI/HI with medication.  Endorses Vyvanse was working well for her previously but over the past month is sub-therapeutic. Grades are slipping with inattention.  Past Medical History  Diagnosis Date  . Childhood asthma   . Migraines   . Depression   . Immune deficiency disorder (HCC)     pt not with much info avail, says brother has same defect but is worse and has to have IgG infusions    Current Outpatient Prescriptions on File Prior to Visit  Medication Sig Dispense Refill  . buPROPion (WELLBUTRIN XL) 150 MG 24 hr tablet Take 3 tablets (450 mg total) by mouth daily. 270 tablet 3  . fluticasone (FLONASE) 50 MCG/ACT nasal spray SHAKE WELL AND U 1 TO 2 SPRAYS IEN QD  6  . levocetirizine (XYZAL) 5 MG tablet Take 1 tablet by mouth daily.  6  . montelukast (SINGULAIR) 10 MG tablet Take 1 tablet by mouth daily.  6  . norethindrone-ethinyl estradiol (MICROGESTIN,JUNEL,LOESTRIN) 1-20 MG-MCG tablet Take by mouth.    . topiramate (TOPAMAX) 100 MG tablet Take 100 mg by mouth daily.     No current facility-administered medications on file prior to visit.    No Known Allergies  Family History  Problem Relation Age of Onset  . Hyperlipidemia Other     parent  . Hypertension Mother   . Hypertension Father   . Depression Mother   . Depression Maternal Grandmother   . Depression Paternal Grandmother   . Diabetes Mother   . Arthritis Paternal Grandmother   . Breast cancer Maternal Grandmother   . Breast cancer Paternal Grandmother   . Lung cancer Maternal Grandmother   . Hyperlipidemia Other     grandparent  . Heart disease Other     grandparent  . Diabetes Maternal Grandmother   . Autoimmune disease Brother   . Asthma Brother     Social History   Social History  . Marital Status: Single    Spouse Name:  N/A  . Number of Children: N/A  . Years of Education: N/A   Social History Main Topics  . Smoking status: Former Smoker -- 0.25 packs/day    Quit date: 07/09/2013  . Smokeless tobacco: Never Used  . Alcohol Use: Yes     Comment: rare  . Drug Use: Yes    Special: Marijuana  . Sexual Activity: Yes    Birth Control/ Protection: Pill     Comment: men   Other Topics Concern  . None   Social History Narrative   Review of Systems - See HPI.  All other ROS are negative.  BP 114/76 mmHg  Pulse 81  Temp(Src) 98.2 F (36.8 C) (Oral)  Resp 16  Ht  (1.549 m)  Wt 136 lb (61.689 kg)  BMI 25.71 kg/m2  SpO2 98%  LMP 10/15/2014  Physical Exam  Constitutional: She is oriented to person, place, and time and well-developed, well-nourished, and in no distress.  HENT:  Head: Normocephalic and atraumatic.  Eyes: Conjunctivae are normal. Pupils are equal, round, and reactive to light.  Neck: Neck supple.  Cardiovascular: Normal rate, regular rhythm, normal heart sounds and intact distal pulses.   Pulmonary/Chest: Effort normal and breath sounds normal. No respiratory distress. She has no wheezes. She has no rales. She exhibits no tenderness.  Neurological: She is  alert and oriented to person, place, and time.  Skin: Skin is warm and dry. No rash noted.  Psychiatric: Affect normal.  Vitals reviewed.   No results found for this or any previous visit (from the past 2160 hour(s)).  Assessment/Plan: ADD (attention deficit disorder) Will increase to 40 mg daily. Follow-up 1 month.  Anxiety and depression Doing very well. Will continue current regimen.

## 2014-11-13 NOTE — Patient Instructions (Addendum)
Please continue the Wellbutrin as directed. Start the new dose of the Vyvanse. Let me know how it is working for you.  Schedule an appointment for a physical exam as you are overdue.

## 2014-11-13 NOTE — Progress Notes (Signed)
Pre visit review using our clinic review tool, if applicable. No additional management support is needed unless otherwise documented below in the visit note/SLS  

## 2014-11-13 NOTE — Assessment & Plan Note (Signed)
Will increase to 40 mg daily. Follow-up 1 month.

## 2014-11-30 ENCOUNTER — Ambulatory Visit (INDEPENDENT_AMBULATORY_CARE_PROVIDER_SITE_OTHER): Payer: 59 | Admitting: Physician Assistant

## 2014-11-30 ENCOUNTER — Encounter: Payer: Self-pay | Admitting: Physician Assistant

## 2014-11-30 VITALS — BP 106/64 | HR 86 | Temp 98.4°F | Resp 16 | Ht 61.0 in | Wt 138.2 lb

## 2014-11-30 DIAGNOSIS — J019 Acute sinusitis, unspecified: Secondary | ICD-10-CM

## 2014-11-30 DIAGNOSIS — B9689 Other specified bacterial agents as the cause of diseases classified elsewhere: Secondary | ICD-10-CM

## 2014-11-30 MED ORDER — AMOXICILLIN-POT CLAVULANATE 875-125 MG PO TABS: 1.0000 | ORAL_TABLET | Freq: Two times a day (BID) | ORAL | Status: DC

## 2014-11-30 NOTE — Progress Notes (Signed)
Patient presents to clinic today c/o 1 week of worsening sinus pressure, headaches, facial pain and ear pressure (L sided). Denies fever, chills, chest congestion or cough. Has been taking her chronic allergy medications with little relief in symptoms.  Past Medical History  Diagnosis Date  . Childhood asthma   . Migraines   . Depression   . Immune deficiency disorder (HCC)     pt not with much info avail, says brother has same defect but is worse and has to have IgG infusions    Current Outpatient Prescriptions on File Prior to Visit  Medication Sig Dispense Refill  . buPROPion (WELLBUTRIN XL) 150 MG 24 hr tablet Take 3 tablets (450 mg total) by mouth daily. 270 tablet 3  . fluticasone (FLONASE) 50 MCG/ACT nasal spray SHAKE WELL AND U 1 TO 2 SPRAYS IEN QD  6  . levocetirizine (XYZAL) 5 MG tablet Take 1 tablet by mouth daily.  6  . lisdexamfetamine (VYVANSE) 40 MG capsule Take 1 capsule (40 mg total) by mouth every morning. 30 capsule 0  . montelukast (SINGULAIR) 10 MG tablet Take 1 tablet by mouth daily.  6  . norethindrone-ethinyl estradiol (MICROGESTIN,JUNEL,LOESTRIN) 1-20 MG-MCG tablet Take by mouth.    . topiramate (TOPAMAX) 100 MG tablet Take 100 mg by mouth daily.     No current facility-administered medications on file prior to visit.    No Known Allergies  Family History  Problem Relation Age of Onset  . Hyperlipidemia Other     parent  . Hypertension Mother   . Hypertension Father   . Depression Mother   . Depression Maternal Grandmother   . Depression Paternal Grandmother   . Diabetes Mother   . Arthritis Paternal Grandmother   . Breast cancer Maternal Grandmother   . Breast cancer Paternal Grandmother   . Lung cancer Maternal Grandmother   . Hyperlipidemia Other     grandparent  . Heart disease Other     grandparent  . Diabetes Maternal Grandmother   . Autoimmune disease Brother   . Asthma Brother     Social History   Social History  . Marital  Status: Single    Spouse Name: N/A  . Number of Children: N/A  . Years of Education: N/A   Social History Main Topics  . Smoking status: Former Smoker -- 0.25 packs/day    Quit date: 07/09/2013  . Smokeless tobacco: Never Used  . Alcohol Use: Yes     Comment: rare  . Drug Use: Yes    Special: Marijuana  . Sexual Activity: Yes    Birth Control/ Protection: Pill     Comment: men   Other Topics Concern  . Not on file   Social History Narrative   Review of Systems - See HPI.  All other ROS are negative.  BP 106/64 mmHg  Pulse 86  Temp(Src) 98.4 F (36.9 C) (Oral)  Resp 16  Ht 5\' 1"  (1.549 m)  Wt 138 lb 4 oz (62.71 kg)  BMI 26.14 kg/m2  SpO2 100%  Physical Exam  Constitutional: She is oriented to person, place, and time and well-developed, well-nourished, and in no distress.  HENT:  Head: Normocephalic and atraumatic.  Right Ear: External ear normal.  Left Ear: External ear normal.  Nose: Nose normal.  Mouth/Throat: Oropharynx is clear and moist. No oropharyngeal exudate.  + TTP of sinuses  Eyes: Conjunctivae are normal.  Neck: Neck supple.  Cardiovascular: Normal rate, regular rhythm, normal heart sounds and  intact distal pulses.   Pulmonary/Chest: Effort normal and breath sounds normal. No respiratory distress. She has no wheezes. She has no rales. She exhibits no tenderness.  Neurological: She is alert and oriented to person, place, and time.  Skin: Skin is warm and dry. No rash noted.  Psychiatric: Affect normal.  Vitals reviewed.   No results found for this or any previous visit (from the past 2160 hour(s)).  Assessment/Plan: Acute bacterial sinusitis Rx Augmentin.  Increase fluids.  Rest.  Saline nasal spray.  Probiotic.  Mucinex as directed.  Humidifier in bedroom.  Call or return to clinic if symptoms are not improving.

## 2014-11-30 NOTE — Progress Notes (Signed)
Pre visit review using our clinic review tool, if applicable. No additional management support is needed unless otherwise documented below in the visit note/SLS  

## 2014-11-30 NOTE — Patient Instructions (Signed)
Please take antibiotic as directed.  Increase fluid intake.  Use Saline nasal spray.  Take a daily multivitamin.  Place a humidifier in the bedroom.  Please call or return clinic if symptoms are not improving.  Sinusitis Sinusitis is redness, soreness, and swelling (inflammation) of the paranasal sinuses. Paranasal sinuses are air pockets within the bones of your face (beneath the eyes, the middle of the forehead, or above the eyes). In healthy paranasal sinuses, mucus is able to drain out, and air is able to circulate through them by way of your nose. However, when your paranasal sinuses are inflamed, mucus and air can become trapped. This can allow bacteria and other germs to grow and cause infection. Sinusitis can develop quickly and last only a short time (acute) or continue over a long period (chronic). Sinusitis that lasts for more than 12 weeks is considered chronic.  CAUSES  Causes of sinusitis include:  Allergies.  Structural abnormalities, such as displacement of the cartilage that separates your nostrils (deviated septum), which can decrease the air flow through your nose and sinuses and affect sinus drainage.  Functional abnormalities, such as when the small hairs (cilia) that line your sinuses and help remove mucus do not work properly or are not present. SYMPTOMS  Symptoms of acute and chronic sinusitis are the same. The primary symptoms are pain and pressure around the affected sinuses. Other symptoms include:  Upper toothache.  Earache.  Headache.  Bad breath.  Decreased sense of smell and taste.  A cough, which worsens when you are lying flat.  Fatigue.  Fever.  Thick drainage from your nose, which often is green and may contain pus (purulent).  Swelling and warmth over the affected sinuses. DIAGNOSIS  Your caregiver will perform a physical exam. During the exam, your caregiver may:  Look in your nose for signs of abnormal growths in your nostrils (nasal  polyps).  Tap over the affected sinus to check for signs of infection.  View the inside of your sinuses (endoscopy) with a special imaging device with a light attached (endoscope), which is inserted into your sinuses. If your caregiver suspects that you have chronic sinusitis, one or more of the following tests may be recommended:  Allergy tests.  Nasal culture A sample of mucus is taken from your nose and sent to a lab and screened for bacteria.  Nasal cytology A sample of mucus is taken from your nose and examined by your caregiver to determine if your sinusitis is related to an allergy. TREATMENT  Most cases of acute sinusitis are related to a viral infection and will resolve on their own within 10 days. Sometimes medicines are prescribed to help relieve symptoms (pain medicine, decongestants, nasal steroid sprays, or saline sprays).  However, for sinusitis related to a bacterial infection, your caregiver will prescribe antibiotic medicines. These are medicines that will help kill the bacteria causing the infection.  Rarely, sinusitis is caused by a fungal infection. In theses cases, your caregiver will prescribe antifungal medicine. For some cases of chronic sinusitis, surgery is needed. Generally, these are cases in which sinusitis recurs more than 3 times per year, despite other treatments. HOME CARE INSTRUCTIONS   Drink plenty of water. Water helps thin the mucus so your sinuses can drain more easily.  Use a humidifier.  Inhale steam 3 to 4 times a day (for example, sit in the bathroom with the shower running).  Apply a warm, moist washcloth to your face 3 to 4 times a day,   or as directed by your caregiver.  Use saline nasal sprays to help moisten and clean your sinuses.  Take over-the-counter or prescription medicines for pain, discomfort, or fever only as directed by your caregiver. SEEK IMMEDIATE MEDICAL CARE IF:  You have increasing pain or severe headaches.  You have  nausea, vomiting, or drowsiness.  You have swelling around your face.  You have vision problems.  You have a stiff neck.  You have difficulty breathing. MAKE SURE YOU:   Understand these instructions.  Will watch your condition.  Will get help right away if you are not doing well or get worse. Document Released: 12/26/2004 Document Revised: 03/20/2011 Document Reviewed: 01/10/2011 [employer] Patient Information 2014 [employer].   

## 2014-11-30 NOTE — Assessment & Plan Note (Signed)
Rx Augmentin.  Increase fluids.  Rest.  Saline nasal spray.  Probiotic.  Mucinex as directed.  Humidifier in bedroom.  Call or return to clinic if symptoms are not improving.  

## 2014-12-01 ENCOUNTER — Telehealth: Payer: Self-pay | Admitting: Physician Assistant

## 2014-12-01 DIAGNOSIS — J019 Acute sinusitis, unspecified: Principal | ICD-10-CM

## 2014-12-01 DIAGNOSIS — B9689 Other specified bacterial agents as the cause of diseases classified elsewhere: Secondary | ICD-10-CM

## 2014-12-01 MED ORDER — AMOXICILLIN-POT CLAVULANATE 875-125 MG PO TABS: 1.0000 | ORAL_TABLET | Freq: Two times a day (BID) | ORAL | Status: DC

## 2014-12-01 NOTE — Telephone Encounter (Signed)
Resent

## 2014-12-01 NOTE — Telephone Encounter (Signed)
Pharmacy did not receive med order per mother. Shows they confirmed receiving it. Please resend.  [facility] [id number] - [place], [place] - [address] [facility] [address]

## 2014-12-18 ENCOUNTER — Encounter: Payer: Self-pay | Admitting: Physician Assistant

## 2014-12-18 ENCOUNTER — Ambulatory Visit (INDEPENDENT_AMBULATORY_CARE_PROVIDER_SITE_OTHER): Payer: 59 | Admitting: Physician Assistant

## 2014-12-18 VITALS — BP 110/70 | HR 100 | Temp 98.3°F | Wt 137.0 lb

## 2014-12-18 DIAGNOSIS — J020 Streptococcal pharyngitis: Secondary | ICD-10-CM

## 2014-12-18 LAB — POCT RAPID STREP A (OFFICE): RAPID STREP A SCREEN: POSITIVE — AB

## 2014-12-18 MED ORDER — CEFDINIR 300 MG PO CAPS: 300.0000 mg | ORAL_CAPSULE | Freq: Two times a day (BID) | ORAL | Status: DC

## 2014-12-18 NOTE — Progress Notes (Signed)
Patient presents to clinic today c/o 1.5 days of sore throat and bilateral ear pain. Has not checked her temperature but felt feverish all evening. Denies cough or congestion. Denies recent travel or sick contact. Recently finished round of Augmentin for bacterial sinusitis. 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  Past Medical History  Diagnosis Date  . Childhood asthma   . Migraines   . Depression   . Immune deficiency disorder (HCC)     pt not with much info avail, says brother has same defect but is worse and has to have IgG infusions    Current Outpatient Prescriptions on File Prior to Visit  Medication Sig Dispense Refill  . buPROPion (WELLBUTRIN XL) 150 MG 24 hr tablet Take 3 tablets (450 mg total) by mouth daily. 270 tablet 3  . fluticasone (FLONASE) 50 MCG/ACT nasal spray SHAKE WELL AND U 1 TO 2 SPRAYS IEN QD  6  . levocetirizine (XYZAL) 5 MG tablet Take 1 tablet by mouth daily.  6  . lisdexamfetamine (VYVANSE) 40 MG capsule Take 1 capsule (40 mg total) by mouth every morning. 30 capsule 0  . montelukast (SINGULAIR) 10 MG tablet Take 1 tablet by mouth daily.  6  . norethindrone-ethinyl estradiol (MICROGESTIN,JUNEL,LOESTRIN) 1-20 MG-MCG tablet Take by mouth.    . topiramate (TOPAMAX) 100 MG tablet Take 100 mg by mouth daily.    Marland Kitchen amoxicillin-clavulanate (AUGMENTIN) 875-125 MG tablet Take 1 tablet by mouth 2 (two) times daily. (Patient not taking: Reported on 12/18/2014) 14 tablet 0   No current facility-administered medications on file prior to visit.    No Known Allergies  Family History  Problem Relation Age of Onset  . Hyperlipidemia Other     parent  . Hypertension Mother   . Hypertension Father   . Depression Mother   . Depression Maternal Grandmother   . Depression Paternal Grandmother   . Diabetes Mother   . Arthritis Paternal Grandmother   . Breast cancer Maternal Grandmother   . Breast cancer Paternal Grandmother   . Lung cancer  Maternal Grandmother   . Hyperlipidemia Other     grandparent  . Heart disease Other     grandparent  . Diabetes Maternal Grandmother   . Autoimmune disease Brother   . Asthma Brother     Social History   Social History  . Marital Status: Single    Spouse Name: N/A  . Number of Children: N/A  . Years of Education: N/A   Social History Main Topics  . Smoking status: Former Smoker -- 0.25 packs/day    Quit date: 07/09/2013  . Smokeless tobacco: Never Used  . Alcohol Use: Yes     Comment: rare  . Drug Use: Yes    Special: Marijuana  . Sexual Activity: Yes    Birth Control/ Protection: Pill     Comment: men   Other Topics Concern  . Not on file   Social History Narrative   Review of Systems - See HPI.  All other ROS are negative.  BP 110/70 mmHg  Pulse 100  Temp(Src) 98.3 F (36.8 C)  Wt 137 lb (62.143 kg)  SpO2 98%  Physical Exam  Constitutional: She is oriented to person, place, and time and well-developed, well-nourished, and in no distress.  HENT:  Head: Normocephalic and atraumatic.  Right Ear: Tympanic membrane normal.  Left Ear: Tympanic membrane normal.  Mouth/Throat: Uvula is midline and mucous membranes are normal. Oropharyngeal exudate and posterior oropharyngeal  erythema present. No posterior oropharyngeal edema or tonsillar abscesses.  Eyes: Conjunctivae are normal.  Cardiovascular: Normal rate, regular rhythm, normal heart sounds and intact distal pulses.   Pulmonary/Chest: Effort normal and breath sounds normal. No respiratory distress. She has no wheezes. She has no rales. She exhibits no tenderness.  Neurological: She is alert and oriented to person, place, and time.  Skin: Skin is warm and dry. No rash noted.  Psychiatric: Affect normal.  Vitals reviewed.   No results found for this or any previous visit (from the past 2160 hour(s)).  Assessment/Plan: Strep pharyngitis Rapid strep +. Rx Cefdinir due to recent use of penicillin antibiotic.  Supportive measures reviewed. Follow-up if symptoms are not resolving.

## 2014-12-18 NOTE — Patient Instructions (Signed)
Please take the antibiotic as directed.  Increase fluids and get plenty of rest. Place a humidifier in the bedroom. Start a daily probiotic. Take Tylenol if needed for fever or throat pain. Salt water gargles will also be beneficial.  Strep Throat Strep throat is a bacterial infection of the throat. Your health care provider may call the infection tonsillitis or pharyngitis, depending on whether there is swelling in the tonsils or at the back of the throat. Strep throat is most common during the cold months of the year in children who are 69-22 years of age, but it can happen during any season in people of any age. This infection is spread from person to person (contagious) through coughing, sneezing, or close contact. CAUSES Strep throat is caused by the bacteria called Streptococcus pyogenes. RISK FACTORS This condition is more likely to develop in:  People who spend time in crowded places where the infection can spread easily.  People who have close contact with someone who has strep throat. SYMPTOMS Symptoms of this condition include:  Fever or chills.   Redness, swelling, or pain in the tonsils or throat.  Pain or difficulty when swallowing.  White or yellow spots on the tonsils or throat.  Swollen, tender glands in the neck or under the jaw.  Red rash all over the body (rare). DIAGNOSIS This condition is diagnosed by performing a rapid strep test or by taking a swab of your throat (throat culture test). Results from a rapid strep test are usually ready in a few minutes, but throat culture test results are available after one or two days. TREATMENT This condition is treated with antibiotic medicine. HOME CARE INSTRUCTIONS Medicines  Take over-the-counter and prescription medicines only as told by your health care provider.  Take your antibiotic as told by your health care provider. Do not stop taking the antibiotic even if you start to feel better.  Have family members  who also have a sore throat or fever tested for strep throat. They may need antibiotics if they have the strep infection. Eating and Drinking  Do not share food, drinking cups, or personal items that could cause the infection to spread to other people.  If swallowing is difficult, try eating soft foods until your sore throat feels better.  Drink enough fluid to keep your urine clear or pale yellow. General Instructions  Gargle with a salt-water mixture 3-4 times per day or as needed. To make a salt-water mixture, completely dissolve -1 tsp of salt in 1 cup of warm water.  Make sure that all household members wash their hands well.  Get plenty of rest.  Stay home from school or work until you have been taking antibiotics for 24 hours.  Keep all follow-up visits as told by your health care provider. This is important. SEEK MEDICAL CARE IF:  The glands in your neck continue to get bigger.  You develop a rash, cough, or earache.  You cough up a thick liquid that is green, yellow-brown, or bloody.  You have pain or discomfort that does not get better with medicine.  Your problems seem to be getting worse rather than better.  You have a fever. SEEK IMMEDIATE MEDICAL CARE IF:  You have new symptoms, such as vomiting, severe headache, stiff or painful neck, chest pain, or shortness of breath.  You have severe throat pain, drooling, or changes in your voice.  You have swelling of the neck, or the skin on the neck becomes red and tender.  You have signs of dehydration, such as fatigue, dry mouth, and decreased urination.  You become increasingly sleepy, or you cannot wake up completely.  Your joints become red or painful.   This information is not intended to replace advice given to you by your health care provider. Make sure you discuss any questions you have with your health care provider.   Document Released: 12/24/1999 Document Revised: 09/16/2014 Document Reviewed:  04/20/2014 Elsevier Interactive Patient Education [employer]2016 [employer]

## 2014-12-18 NOTE — Assessment & Plan Note (Signed)
Rapid strep +. Rx Cefdinir due to recent use of penicillin antibiotic. Supportive measures reviewed. Follow-up if symptoms are not resolving.

## 2015-01-08 ENCOUNTER — Ambulatory Visit (INDEPENDENT_AMBULATORY_CARE_PROVIDER_SITE_OTHER): Payer: 59 | Admitting: Family Medicine

## 2015-01-08 ENCOUNTER — Encounter: Payer: Self-pay | Admitting: Family Medicine

## 2015-01-08 VITALS — BP 130/81 | HR 110 | Temp 98.1°F | Resp 20 | Wt 134.5 lb

## 2015-01-08 DIAGNOSIS — B9689 Other specified bacterial agents as the cause of diseases classified elsewhere: Secondary | ICD-10-CM

## 2015-01-08 DIAGNOSIS — J029 Acute pharyngitis, unspecified: Secondary | ICD-10-CM | POA: Diagnosis not present

## 2015-01-08 DIAGNOSIS — J019 Acute sinusitis, unspecified: Secondary | ICD-10-CM | POA: Diagnosis not present

## 2015-01-08 LAB — POCT RAPID STREP A (OFFICE): Rapid Strep A Screen: NEGATIVE

## 2015-01-08 MED ORDER — LEVOFLOXACIN 500 MG PO TABS
500.0000 mg | ORAL_TABLET | Freq: Every day | ORAL | Status: DC
Start: 2015-01-08 — End: 2015-01-20

## 2015-01-08 NOTE — Patient Instructions (Signed)
Sinusitis, Adult Sinusitis is redness, soreness, and inflammation of the paranasal sinuses. Paranasal sinuses are air pockets within the bones of your face. They are located beneath your eyes, in the middle of your forehead, and above your eyes. In healthy paranasal sinuses, mucus is able to drain out, and air is able to circulate through them by way of your nose. However, when your paranasal sinuses are inflamed, mucus and air can become trapped. This can allow bacteria and other germs to grow and cause infection. Sinusitis can develop quickly and last only a short time (acute) or continue over a long period (chronic). Sinusitis that lasts for more than 12 weeks is considered chronic. CAUSES Causes of sinusitis include:  Allergies.  Structural abnormalities, such as displacement of the cartilage that separates your nostrils (deviated septum), which can decrease the air flow through your nose and sinuses and affect sinus drainage.  Functional abnormalities, such as when the small hairs (cilia) that line your sinuses and help remove mucus do not work properly or are not present. SIGNS AND SYMPTOMS Symptoms of acute and chronic sinusitis are the same. The primary symptoms are pain and pressure around the affected sinuses. Other symptoms include:  Upper toothache.  Earache.  Headache.  Bad breath.  Decreased sense of smell and taste.  A cough, which worsens when you are lying flat.  Fatigue.  Fever.  Thick drainage from your nose, which often is green and may contain pus (purulent).  Swelling and warmth over the affected sinuses. DIAGNOSIS Your health care provider will perform a physical exam. During your exam, your health care provider may perform any of the following to help determine if you have acute sinusitis or chronic sinusitis:  Look in your nose for signs of abnormal growths in your nostrils (nasal polyps).  Tap over the affected sinus to check for signs of  infection.  View the inside of your sinuses using an imaging device that has a light attached (endoscope). If your health care provider suspects that you have chronic sinusitis, one or more of the following tests may be recommended:  Allergy tests.  Nasal culture. A sample of mucus is taken from your nose, sent to a lab, and screened for bacteria.  Nasal cytology. A sample of mucus is taken from your nose and examined by your health care provider to determine if your sinusitis is related to an allergy. TREATMENT Most cases of acute sinusitis are related to a viral infection and will resolve on their own within 10 days. Sometimes, medicines are prescribed to help relieve symptoms of both acute and chronic sinusitis. These may include pain medicines, decongestants, nasal steroid sprays, or saline sprays. However, for sinusitis related to a bacterial infection, your health care provider will prescribe antibiotic medicines. These are medicines that will help kill the bacteria causing the infection. Rarely, sinusitis is caused by a fungal infection. In these cases, your health care provider will prescribe antifungal medicine. For some cases of chronic sinusitis, surgery is needed. Generally, these are cases in which sinusitis recurs more than 3 times per year, despite other treatments. HOME CARE INSTRUCTIONS  Drink plenty of water. Water helps thin the mucus so your sinuses can drain more easily.  Use a humidifier.  Inhale steam 3-4 times a day (for example, sit in the bathroom with the shower running).  Apply a warm, moist washcloth to your face 3-4 times a day, or as directed by your health care provider.  Use saline nasal sprays to help   moisten and clean your sinuses.  Take medicines only as directed by your health care provider.  If you were prescribed either an antibiotic or antifungal medicine, finish it all even if you start to feel better. SEEK IMMEDIATE MEDICAL CARE IF:  You have  increasing pain or severe headaches.  You have nausea, vomiting, or drowsiness.  You have swelling around your face.  You have vision problems.  You have a stiff neck.  You have difficulty breathing.   This information is not intended to replace advice given to you by your health care provider. Make sure you discuss any questions you have with your health care provider.   Document Released: 12/26/2004 Document Revised: 01/16/2014 Document Reviewed: 01/10/2011 Elsevier Interactive Patient Education [employer]2016 [employer]  I have called in an antibiotic called Levaquin for you to take for your chronic sinusitis.  Take your flonase, xyzal and singular everyday.

## 2015-01-08 NOTE — Progress Notes (Signed)
   Subjective:    Patient ID: Yolanda Phelps   DOB: Jun 24, 1995, 19 y.o.    MRN: [id number]  HPI  Sore throat: Pt states yesterday she woke up with a severe sore throat, rhinorrhea and headache. She feels her throat is swelling. She reports a co-worker is sick. She has been treated twice in 2.5 months with Augmentin and cefdinir for pharyngitis and sinusitis. She states she has an immune deficiency... Of unknown type that causes her to get frequent infections.Patient endorses intermittent use of flonase. She does take her xyzal and singular daily. She denies fever, chills, abd pain, nausea or vomit. She has had mild diarrhea for 2 days.   OTC: advil yesterday--> headache. Throat lozenges.  Eating and drinking well.  Flu shot UTD  Past Medical History  Diagnosis Date  . Childhood asthma   . Migraines   . Depression   . Immune deficiency disorder (HCC)     pt not with much info avail, says brother has same defect but is worse and has to have IgG infusions  . ADHD (attention deficit hyperactivity disorder)    No Known Allergies Past Surgical History  Procedure Laterality Date  . Adenoidectomy  1997  . Wisdom tooth extraction  2014   Social History  Substance Use Topics  . Smoking status: Former Smoker -- 0.25 packs/day    Quit date: 07/09/2013  . Smokeless tobacco: Never Used  . Alcohol Use: Yes     Comment: rare    Review of Systems Negative, with the exception of above mentioned in HPI     Objective:   Physical Exam BP 130/81 mmHg  Pulse 110  Temp(Src) 98.1 F (36.7 C)  Resp 20  Wt 134 lb 8 oz (61.009 kg)  SpO2 100% Body mass index is 25.43 kg/(m^2). Gen: Afebrile. No acute distress. Nontoxic in appearance. Well developed, well nourished, pleasant female HENT: AT. [place]. Bilateral TM visualized, shiny/full. MMM, no oral lesions. Bilateral nares severe erythema, mild swelling, drainage. Throat with erythema, no exudates. Cough and  Hoarseness not present, mild  TTP maxillary  sinus.  Eyes:Pupils Equal Round Reactive to light, Extraocular movements intact,  Conjunctiva without redness, discharge or icterus. Neck/lymp/endocrine: Supple,ant cervical lymphadenopathy CV: RRR  Chest: CTAB, no wheeze or crackles. Good air movement.  Abd: Soft. NTND. BS present Skin: No rashes, purpura or petechiae.    Assessment & Plan:  Yolanda Phelps is a 19 y.o. present for acute OV with reoccurring sinusitis.  1. Sore throat - POCT rapid strep A--> negative  2. Acute bacterial sinusitis - pt with ? Immune deficiency with recent treatment of sinusitis and strep pharyngitis with Augmentin then cefdinir with the past 2 months. She does still have signs of sinus infection today.  Treat with  Levaquin.  - Advised her to use flonase daily, with singular and xzyal .  - discussed Flonase's role in keeping the integrity of nasal lining, therefore hopefully reducing the amount viral/bacterial infections. Istructed  Pt on proper flonase application.  - Mucinex  - levofloxacin (LEVAQUIN) 500 MG tablet; Take 1 tablet (500 mg total) by mouth daily.  Dispense: 7 tablet; Refill: 0 - AVS provided.  - F/u PCP in 1 week if no improvement.

## 2015-01-20 ENCOUNTER — Encounter: Payer: Self-pay | Admitting: Physician Assistant

## 2015-01-20 ENCOUNTER — Ambulatory Visit (INDEPENDENT_AMBULATORY_CARE_PROVIDER_SITE_OTHER): Payer: 59 | Admitting: Physician Assistant

## 2015-01-20 VITALS — BP 116/70 | HR 102 | Temp 98.4°F | Ht 61.0 in | Wt 135.8 lb

## 2015-01-20 DIAGNOSIS — F988 Other specified behavioral and emotional disorders with onset usually occurring in childhood and adolescence: Secondary | ICD-10-CM

## 2015-01-20 DIAGNOSIS — Z0000 Encounter for general adult medical examination without abnormal findings: Secondary | ICD-10-CM | POA: Diagnosis not present

## 2015-01-20 DIAGNOSIS — G43709 Chronic migraine without aura, not intractable, without status migrainosus: Secondary | ICD-10-CM

## 2015-01-20 DIAGNOSIS — J309 Allergic rhinitis, unspecified: Secondary | ICD-10-CM

## 2015-01-20 MED ORDER — LISDEXAMFETAMINE DIMESYLATE 40 MG PO CAPS: 40.0000 mg | ORAL_CAPSULE | ORAL | Status: DC

## 2015-01-20 MED ORDER — AZELASTINE HCL 0.1 % NA SOLN: 2.0000 | Freq: Two times a day (BID) | NASAL | Status: AC

## 2015-01-20 MED ORDER — LISDEXAMFETAMINE DIMESYLATE 40 MG PO CAPS: 40.0000 mg | ORAL_CAPSULE | ORAL | Status: AC

## 2015-01-20 MED ORDER — TOPIRAMATE 100 MG PO TABS: 50.0000 mg | ORAL_TABLET | Freq: Every day | ORAL | Status: AC

## 2015-01-20 NOTE — Assessment & Plan Note (Signed)
Depression screen negative. Health Maintenance reviewed -- flu and tetanus up-to-date. Endorses HIV and Chalmy[place] screen in 2016 with GYN (normal per patient) Preventive schedule discussed and handout given in AVS. Will obtain fasting labs today.

## 2015-01-20 NOTE — Progress Notes (Signed)
Pre visit review using our clinic review tool, if applicable. No additional management support is needed unless otherwise documented below in the visit note. 

## 2015-01-20 NOTE — Patient Instructions (Signed)
Please continue medications as directed with the following exceptions: Try to take 1/2 tablet of your Topamax daily over the next 2 weeks. Call me and let me know how your headaches are. We can then make a further decision about weaning off of medications.  Don't forget to stop by the lab for blood work. I will call you with your results.  Preventive Care for Adults, Female A healthy lifestyle and preventive care can promote health and wellness. Preventive health guidelines for women include the following key practices.  A routine yearly physical is a good way to check with your health care provider about your health and preventive screening. It is a chance to share any concerns and updates on your health and to receive a thorough exam.  Visit your dentist for a routine exam and preventive care every 6 months. Brush your teeth twice a day and floss once a day. Good oral hygiene prevents tooth decay and gum disease.  The frequency of eye exams is based on your age, health, family medical history, use of contact lenses, and other factors. Follow your health care provider's recommendations for frequency of eye exams.  Eat a healthy diet. Foods like vegetables, fruits, whole grains, low-fat dairy products, and lean protein foods contain the nutrients you need without too many calories. Decrease your intake of foods high in solid fats, added sugars, and salt. Eat the right amount of calories for you.Get information about a proper diet from your health care provider, if necessary.  Regular physical exercise is one of the most important things you can do for your health. Most adults should get at least 150 minutes of moderate-intensity exercise (any activity that increases your heart rate and causes you to sweat) each week. In addition, most adults need muscle-strengthening exercises on 2 or more days a week.  Maintain a healthy weight. The body mass index (BMI) is a screening tool to identify possible  weight problems. It provides an estimate of body fat based on height and weight. Your health care provider can find your BMI and can help you achieve or maintain a healthy weight.For adults 20 years and older:  A BMI below 18.5 is considered underweight.  A BMI of 18.5 to 24.9 is normal.  A BMI of 25 to 29.9 is considered overweight.  A BMI of 30 and above is considered obese.  Maintain normal blood lipids and cholesterol levels by exercising and minimizing your intake of saturated fat. Eat a balanced diet with plenty of fruit and vegetables. Blood tests for lipids and cholesterol should begin at age 74 and be repeated every 5 years. If your lipid or cholesterol levels are high, you are over 50, or you are at high risk for heart disease, you may need your cholesterol levels checked more frequently.Ongoing high lipid and cholesterol levels should be treated with medicines if diet and exercise are not working.  If you smoke, find out from your health care provider how to quit. If you do not use tobacco, do not start.  Lung cancer screening is recommended for adults aged 14-80 years who are at high risk for developing lung cancer because of a history of smoking. A yearly low-dose CT scan of the lungs is recommended for people who have at least a 30-pack-year history of smoking and are a current smoker or have quit within the past 15 years. A pack year of smoking is smoking an average of 1 pack of cigarettes a day for 1 year (for  example: 1 pack a day for 30 years or 2 packs a day for 15 years). Yearly screening should continue until the smoker has stopped smoking for at least 15 years. Yearly screening should be stopped for people who develop a health problem that would prevent them from having lung cancer treatment.  If you are pregnant, do not drink alcohol. If you are breastfeeding, be very cautious about drinking alcohol. If you are not pregnant and choose to drink alcohol, do not have more than 1  drink per day. One drink is considered to be 12 ounces (355 mL) of beer, 5 ounces (148 mL) of wine, or 1.5 ounces (44 mL) of liquor.  Avoid use of street drugs. Do not share needles with anyone. Ask for help if you need support or instructions about stopping the use of drugs.  High blood pressure causes heart disease and increases the risk of stroke. Your blood pressure should be checked at least every 1 to 2 years. Ongoing high blood pressure should be treated with medicines if weight loss and exercise do not work.  If you are 70-59 years old, ask your health care provider if you should take aspirin to prevent strokes.  Diabetes screening is done by taking a blood sample to check your blood glucose level after you have not eaten for a certain period of time (fasting). If you are not overweight and you do not have risk factors for diabetes, you should be screened once every 3 years starting at age 52. If you are overweight or obese and you are 64-87 years of age, you should be screened for diabetes every year as part of your cardiovascular risk assessment.  Breast cancer screening is essential preventive care for women. You should practice "breast self-awareness." This means understanding the normal appearance and feel of your breasts and may include breast self-examination. Any changes detected, no matter how small, should be reported to a health care provider. Women in their 72s and 30s should have a clinical breast exam (CBE) by a health care provider as part of a regular health exam every 1 to 3 years. After age 62, women should have a CBE every year. Starting at age 40, women should consider having a mammogram (breast X-ray test) every year. Women who have a family history of breast cancer should talk to their health care provider about genetic screening. Women at a high risk of breast cancer should talk to their health care providers about having an MRI and a mammogram every year.  Breast cancer  gene (BRCA)-related cancer risk assessment is recommended for women who have family members with BRCA-related cancers. BRCA-related cancers include breast, ovarian, tubal, and peritoneal cancers. Having family members with these cancers may be associated with an increased risk for harmful changes (mutations) in the breast cancer genes BRCA1 and BRCA2. Results of the assessment will determine the need for genetic counseling and BRCA1 and BRCA2 testing.  Your health care provider may recommend that you be screened regularly for cancer of the pelvic organs (ovaries, uterus, and vagina). This screening involves a pelvic examination, including checking for microscopic changes to the surface of your cervix (Pap test). You may be encouraged to have this screening done every 3 years, beginning at age 69.  For women ages 90-65, health care providers may recommend pelvic exams and Pap testing every 3 years, or they may recommend the Pap and pelvic exam, combined with testing for human papilloma virus (HPV), every 5 years. Some types of  HPV increase your risk of cervical cancer. Testing for HPV may also be done on women of any age with unclear Pap test results.  Other health care providers may not recommend any screening for nonpregnant women who are considered low risk for pelvic cancer and who do not have symptoms. Ask your health care provider if a screening pelvic exam is right for you.  If you have had past treatment for cervical cancer or a condition that could lead to cancer, you need Pap tests and screening for cancer for at least 20 years after your treatment. If Pap tests have been discontinued, your risk factors (such as having a new sexual partner) need to be reassessed to determine if screening should resume. Some women have medical problems that increase the chance of getting cervical cancer. In these cases, your health care provider may recommend more frequent screening and Pap tests.  Colorectal  cancer can be detected and often prevented. Most routine colorectal cancer screening begins at the age of 77 years and continues through age 57 years. However, your health care provider may recommend screening at an earlier age if you have risk factors for colon cancer. On a yearly basis, your health care provider may provide home test kits to check for hidden blood in the stool. Use of a small camera at the end of a tube, to directly examine the colon (sigmoidoscopy or colonoscopy), can detect the earliest forms of colorectal cancer. Talk to your health care provider about this at age 92, when routine screening begins. Direct exam of the colon should be repeated every 5-10 years through age 65 years, unless early forms of precancerous polyps or small growths are found.  People who are at an increased risk for hepatitis B should be screened for this virus. You are considered at high risk for hepatitis B if:  You were born in a country where hepatitis B occurs often. Talk with your health care provider about which countries are considered high risk.  Your parents were born in a high-risk country and you have not received a shot to protect against hepatitis B (hepatitis B vaccine).  You have HIV or AIDS.  You use needles to inject street drugs.  You live with, or have sex with, someone who has hepatitis B.  You get hemodialysis treatment.  You take certain medicines for conditions like cancer, organ transplantation, and autoimmune conditions.  Hepatitis C blood testing is recommended for all people born from [birth year] through 1965 and any individual with known risks for hepatitis C.  Practice safe sex. Use condoms and avoid high-risk sexual practices to reduce the spread of sexually transmitted infections (STIs). STIs include gonorrhea, chlamydia, syphilis, trichomonas, herpes, HPV, and human immunodeficiency virus (HIV). Herpes, HIV, and HPV are viral illnesses that have no cure. They can result in  disability, cancer, and death.  You should be screened for sexually transmitted illnesses (STIs) including gonorrhea and chlamydia if:  You are sexually active and are younger than 24 years.  You are older than 24 years and your health care provider tells you that you are at risk for this type of infection.  Your sexual activity has changed since you were last screened and you are at an increased risk for chlamydia or gonorrhea. Ask your health care provider if you are at risk.  If you are at risk of being infected with HIV, it is recommended that you take a prescription medicine daily to prevent HIV infection. This is called preexposure  prophylaxis (PrEP). You are considered at risk if:  You are sexually active and do not regularly use condoms or know the HIV status of your partner(s).  You take drugs by injection.  You are sexually active with a partner who has HIV.  Talk with your health care provider about whether you are at high risk of being infected with HIV. If you choose to begin PrEP, you should first be tested for HIV. You should then be tested every 3 months for as long as you are taking PrEP.  Osteoporosis is a disease in which the bones lose minerals and strength with aging. This can result in serious bone fractures or breaks. The risk of osteoporosis can be identified using a bone density scan. Women ages 91 years and over and women at risk for fractures or osteoporosis should discuss screening with their health care providers. Ask your health care provider whether you should take a calcium supplement or vitamin D to reduce the rate of osteoporosis.  Menopause can be associated with physical symptoms and risks. Hormone replacement therapy is available to decrease symptoms and risks. You should talk to your health care provider about whether hormone replacement therapy is right for you.  Use sunscreen. Apply sunscreen liberally and repeatedly throughout the day. You should seek  shade when your shadow is shorter than you. Protect yourself by wearing long sleeves, pants, a wide-brimmed hat, and sunglasses year round, whenever you are outdoors.  Once a month, do a whole body skin exam, using a mirror to look at the skin on your back. Tell your health care provider of new moles, moles that have irregular borders, moles that are larger than a pencil eraser, or moles that have changed in shape or color.  Stay current with required vaccines (immunizations).  Influenza vaccine. All adults should be immunized every year.  Tetanus, diphtheria, and acellular pertussis (Td, Tdap) vaccine. Pregnant women should receive 1 dose of Tdap vaccine during each pregnancy. The dose should be obtained regardless of the length of time since the last dose. Immunization is preferred during the 27th-36th week of gestation. An adult who has not previously received Tdap or who does not know her vaccine status should receive 1 dose of Tdap. This initial dose should be followed by tetanus and diphtheria toxoids (Td) booster doses every 10 years. Adults with an unknown or incomplete history of completing a 3-dose immunization series with Td-containing vaccines should begin or complete a primary immunization series including a Tdap dose. Adults should receive a Td booster every 10 years.  Varicella vaccine. An adult without evidence of immunity to varicella should receive 2 doses or a second dose if she has previously received 1 dose. Pregnant females who do not have evidence of immunity should receive the first dose after pregnancy. This first dose should be obtained before leaving the health care facility. The second dose should be obtained 4-8 weeks after the first dose.  Human papillomavirus (HPV) vaccine. Females aged 13-26 years who have not received the vaccine previously should obtain the 3-dose series. The vaccine is not recommended for use in pregnant females. However, pregnancy testing is not needed  before receiving a dose. If a female is found to be pregnant after receiving a dose, no treatment is needed. In that case, the remaining doses should be delayed until after the pregnancy. Immunization is recommended for any person with an immunocompromised condition through the age of 68 years if she did not get any or all doses  earlier. During the 3-dose series, the second dose should be obtained 4-8 weeks after the first dose. The third dose should be obtained 24 weeks after the first dose and 16 weeks after the second dose.  Zoster vaccine. One dose is recommended for adults aged 60 years or older unless certain conditions are present.  Measles, mumps, and rubella (MMR) vaccine. Adults born before [birth year] generally are considered immune to measles and mumps. Adults born in [birth year] or later should have 1 or more doses of MMR vaccine unless there is a contraindication to the vaccine or there is laboratory evidence of immunity to each of the three diseases. A routine second dose of MMR vaccine should be obtained at least 28 days after the first dose for students attending postsecondary schools, health care workers, or international travelers. People who received inactivated measles vaccine or an unknown type of measles vaccine during 1963-1967 should receive 2 doses of MMR vaccine. People who received inactivated mumps vaccine or an unknown type of mumps vaccine before 1979 and are at high risk for mumps infection should consider immunization with 2 doses of MMR vaccine. For females of childbearing age, rubella immunity should be determined. If there is no evidence of immunity, females who are not pregnant should be vaccinated. If there is no evidence of immunity, females who are pregnant should delay immunization until after pregnancy. Unvaccinated health care workers born before [birth year] who lack laboratory evidence of measles, mumps, or rubella immunity or laboratory confirmation of disease should consider measles and  mumps immunization with 2 doses of MMR vaccine or rubella immunization with 1 dose of MMR vaccine.  Pneumococcal 13-valent conjugate (PCV13) vaccine. When indicated, a person who is uncertain of his immunization history and has no record of immunization should receive the PCV13 vaccine. All adults 67 years of age and older should receive this vaccine. An adult aged 74 years or older who has certain medical conditions and has not been previously immunized should receive 1 dose of PCV13 vaccine. This PCV13 should be followed with a dose of pneumococcal polysaccharide (PPSV23) vaccine. Adults who are at high risk for pneumococcal disease should obtain the PPSV23 vaccine at least 8 weeks after the dose of PCV13 vaccine. Adults older than 21 years of age who have normal immune system function should obtain the PPSV23 vaccine dose at least 1 year after the dose of PCV13 vaccine.  Pneumococcal polysaccharide (PPSV23) vaccine. When PCV13 is also indicated, PCV13 should be obtained first. All adults aged 67 years and older should be immunized. An adult younger than age 65 years who has certain medical conditions should be immunized. Any person who resides in a nursing home or long-term care facility should be immunized. An adult smoker should be immunized. People with an immunocompromised condition and certain other conditions should receive both PCV13 and PPSV23 vaccines. People with human immunodeficiency virus (HIV) infection should be immunized as soon as possible after diagnosis. Immunization during chemotherapy or radiation therapy should be avoided. Routine use of PPSV23 vaccine is not recommended for American Indians, [place] Natives, or people younger than 65 years unless there are medical conditions that require PPSV23 vaccine. When indicated, people who have unknown immunization and have no record of immunization should receive PPSV23 vaccine. One-time revaccination 5 years after the first dose of PPSV23 is  recommended for people aged 19-64 years who have chronic kidney failure, nephrotic syndrome, asplenia, or immunocompromised conditions. People who received 1-2 doses of PPSV23 before age 74 years should receive another  dose of PPSV23 vaccine at age 36 years or later if at least 5 years have passed since the previous dose. Doses of PPSV23 are not needed for people immunized with PPSV23 at or after age 2 years.  Meningococcal vaccine. Adults with asplenia or persistent complement component deficiencies should receive 2 doses of quadrivalent meningococcal conjugate (MenACWY-D) vaccine. The doses should be obtained at least 2 months apart. Microbiologists working with certain meningococcal bacteria, [employer] recruits, people at risk during an outbreak, and people who travel to or live in countries with a high rate of meningitis should be immunized. A first-year college student up through age 37 years who is living in a residence hall should receive a dose if she did not receive a dose on or after her 16th birthday. Adults who have certain high-risk conditions should receive one or more doses of vaccine.  Hepatitis A vaccine. Adults who wish to be protected from this disease, have certain high-risk conditions, work with hepatitis A-infected animals, work in hepatitis A research labs, or travel to or work in countries with a high rate of hepatitis A should be immunized. Adults who were previously unvaccinated and who anticipate close contact with an international adoptee during the first 60 days after arrival in the [place] from a country with a high rate of hepatitis A should be immunized.  Hepatitis B vaccine. Adults who wish to be protected from this disease, have certain high-risk conditions, may be exposed to blood or other infectious body fluids, are household contacts or sex partners of hepatitis B positive people, are clients or workers in certain care facilities, or travel to or work in countries  with a high rate of hepatitis B should be immunized.  Haemophilus influenzae type b (Hib) vaccine. A previously unvaccinated person with asplenia or sickle cell disease or having a scheduled splenectomy should receive 1 dose of Hib vaccine. Regardless of previous immunization, a recipient of a hematopoietic stem cell transplant should receive a 3-dose series 6-12 months after her successful transplant. Hib vaccine is not recommended for adults with HIV infection. Preventive Services / Frequency Ages 42 to 85 years  Blood pressure check.** / Every 3-5 years.  Lipid and cholesterol check.** / Every 5 years beginning at age 16.  Clinical breast exam.** / Every 3 years for women in their 21s and 11s.  BRCA-related cancer risk assessment.** / For women who have family members with a BRCA-related cancer (breast, ovarian, tubal, or peritoneal cancers).  Pap test.** / Every 2 years from ages 46 through 73. Every 3 years starting at age 57 through age 28 or 69 with a history of 3 consecutive normal Pap tests.  HPV screening.** / Every 3 years from ages 36 through ages 27 to 67 with a history of 3 consecutive normal Pap tests.  Hepatitis C blood test.** / For any individual with known risks for hepatitis C.  Skin self-exam. / Monthly.  Influenza vaccine. / Every year.  Tetanus, diphtheria, and acellular pertussis (Tdap, Td) vaccine.** / Consult your health care provider. Pregnant women should receive 1 dose of Tdap vaccine during each pregnancy. 1 dose of Td every 10 years.  Varicella vaccine.** / Consult your health care provider. Pregnant females who do not have evidence of immunity should receive the first dose after pregnancy.  HPV vaccine. / 3 doses over 6 months, if 41 and younger. The vaccine is not recommended for use in pregnant females. However, pregnancy testing is not needed before receiving a dose.  Measles, mumps, rubella (MMR) vaccine.** / You need at least 1 dose of MMR if you  were born in 1957 or later. You may also need a 2nd dose. For females of childbearing age, rubella immunity should be determined. If there is no evidence of immunity, females who are not pregnant should be vaccinated. If there is no evidence of immunity, females who are pregnant should delay immunization until after pregnancy.  Pneumococcal 13-valent conjugate (PCV13) vaccine.** / Consult your health care provider.  Pneumococcal polysaccharide (PPSV23) vaccine.** / 1 to 2 doses if you smoke cigarettes or if you have certain conditions.  Meningococcal vaccine.** / 1 dose if you are age 52 to 43 years and a Market researcher living in a residence hall, or have one of several medical conditions, you need to get vaccinated against meningococcal disease. You may also need additional booster doses.  Hepatitis A vaccine.** / Consult your health care provider.  Hepatitis B vaccine.** / Consult your health care provider.  Haemophilus influenzae type b (Hib) vaccine.** / Consult your health care provider. Ages 74 to 6 years  Blood pressure check.** / Every year.  Lipid and cholesterol check.** / Every 5 years beginning at age 77 years.  Lung cancer screening. / Every year if you are aged 55-80 years and have a 30-pack-year history of smoking and currently smoke or have quit within the past 15 years. Yearly screening is stopped once you have quit smoking for at least 15 years or develop a health problem that would prevent you from having lung cancer treatment.  Clinical breast exam.** / Every year after age 44 years.  BRCA-related cancer risk assessment.** / For women who have family members with a BRCA-related cancer (breast, ovarian, tubal, or peritoneal cancers).  Mammogram.** / Every year beginning at age 7 years and continuing for as long as you are in good health. Consult with your health care provider.  Pap test.** / Every 3 years starting at age 46 years through age 34 or 29 years  with a history of 3 consecutive normal Pap tests.  HPV screening.** / Every 3 years from ages 64 years through ages 60 to 41 years with a history of 3 consecutive normal Pap tests.  Fecal occult blood test (FOBT) of stool. / Every year beginning at age 73 years and continuing until age 39 years. You may not need to do this test if you get a colonoscopy every 10 years.  Flexible sigmoidoscopy or colonoscopy.** / Every 5 years for a flexible sigmoidoscopy or every 10 years for a colonoscopy beginning at age 67 years and continuing until age 69 years.  Hepatitis C blood test.** / For all people born from [birth year] through 1965 and any individual with known risks for hepatitis C.  Skin self-exam. / Monthly.  Influenza vaccine. / Every year.  Tetanus, diphtheria, and acellular pertussis (Tdap/Td) vaccine.** / Consult your health care provider. Pregnant women should receive 1 dose of Tdap vaccine during each pregnancy. 1 dose of Td every 10 years.  Varicella vaccine.** / Consult your health care provider. Pregnant females who do not have evidence of immunity should receive the first dose after pregnancy.  Zoster vaccine.** / 1 dose for adults aged 31 years or older.  Measles, mumps, rubella (MMR) vaccine.** / You need at least 1 dose of MMR if you were born in 1957 or later. You may also need a second dose. For females of childbearing age, rubella immunity should be determined. If there is no  evidence of immunity, females who are not pregnant should be vaccinated. If there is no evidence of immunity, females who are pregnant should delay immunization until after pregnancy.  Pneumococcal 13-valent conjugate (PCV13) vaccine.** / Consult your health care provider.  Pneumococcal polysaccharide (PPSV23) vaccine.** / 1 to 2 doses if you smoke cigarettes or if you have certain conditions.  Meningococcal vaccine.** / Consult your health care provider.  Hepatitis A vaccine.** / Consult your health care  provider.  Hepatitis B vaccine.** / Consult your health care provider.  Haemophilus influenzae type b (Hib) vaccine.** / Consult your health care provider. Ages 12 years and over  Blood pressure check.** / Every year.  Lipid and cholesterol check.** / Every 5 years beginning at age 70 years.  Lung cancer screening. / Every year if you are aged 56-80 years and have a 30-pack-year history of smoking and currently smoke or have quit within the past 15 years. Yearly screening is stopped once you have quit smoking for at least 15 years or develop a health problem that would prevent you from having lung cancer treatment.  Clinical breast exam.** / Every year after age 73 years.  BRCA-related cancer risk assessment.** / For women who have family members with a BRCA-related cancer (breast, ovarian, tubal, or peritoneal cancers).  Mammogram.** / Every year beginning at age 11 years and continuing for as long as you are in good health. Consult with your health care provider.  Pap test.** / Every 3 years starting at age 73 years through age 53 or 59 years with 3 consecutive normal Pap tests. Testing can be stopped between 65 and 70 years with 3 consecutive normal Pap tests and no abnormal Pap or HPV tests in the past 10 years.  HPV screening.** / Every 3 years from ages 50 years through ages 65 or 75 years with a history of 3 consecutive normal Pap tests. Testing can be stopped between 65 and 70 years with 3 consecutive normal Pap tests and no abnormal Pap or HPV tests in the past 10 years.  Fecal occult blood test (FOBT) of stool. / Every year beginning at age 65 years and continuing until age 33 years. You may not need to do this test if you get a colonoscopy every 10 years.  Flexible sigmoidoscopy or colonoscopy.** / Every 5 years for a flexible sigmoidoscopy or every 10 years for a colonoscopy beginning at age 49 years and continuing until age 89 years.  Hepatitis C blood test.** / For all  people born from [birth year] through 1966 and any individual with known risks for hepatitis C.  Osteoporosis screening.** / A one-time screening for women ages 2 years and over and women at risk for fractures or osteoporosis.  Skin self-exam. / Monthly.  Influenza vaccine. / Every year.  Tetanus, diphtheria, and acellular pertussis (Tdap/Td) vaccine.** / 1 dose of Td every 10 years.  Varicella vaccine.** / Consult your health care provider.  Zoster vaccine.** / 1 dose for adults aged 36 years or older.  Pneumococcal 13-valent conjugate (PCV13) vaccine.** / Consult your health care provider.  Pneumococcal polysaccharide (PPSV23) vaccine.** / 1 dose for all adults aged 109 years and older.  Meningococcal vaccine.** / Consult your health care provider.  Hepatitis A vaccine.** / Consult your health care provider.  Hepatitis B vaccine.** / Consult your health care provider.  Haemophilus influenzae type b (Hib) vaccine.** / Consult your health care provider. ** Family history and personal history of risk and conditions may change your health  care provider's recommendations.   This information is not intended to replace advice given to you by your health care provider. Make sure you discuss any questions you have with your health care provider.   Document Released: 02/21/2001 Document Revised: 01/16/2014 Document Reviewed: 05/23/2010 Elsevier Interactive Patient Education [employer].

## 2015-01-20 NOTE — Assessment & Plan Note (Signed)
None in quite some time. Will attempt a trial at weaning off of Topamax. Will decrease to 50 mg daily for 2 weeks. Patient to follow-up via phone so further instruction can be given.

## 2015-01-20 NOTE — Assessment & Plan Note (Signed)
Doing very well. Continue current regimen. Meds refilled. Follow-up 6 months.

## 2015-01-20 NOTE — Progress Notes (Signed)
Patient presents for preventive healthcare. Endorses a well-balanced diet and is working on exercise regimen -- Is walking a couple of miles per day at school. Body mass index is 25.67 kg/(m^2).    Patient continued to do very well with her Vyvanse. Did take a holiday from medication while on winter break from school. Will restart when school starts back next week.   Would like to discuss weaning off of her Topamax which was previously prescribed by Neurology for migraines. Has been on Topamax 100 mg for several years. Denies any residual headaches. Is not sure if she needs the medication or at least the same dose anymore.  Past Medical History  Diagnosis Date  . Childhood asthma   . Migraines   . Depression   . Immune deficiency disorder (HCC)     pt not with much info avail, says brother has same defect but is worse and has to have IgG infusions  . ADHD (attention deficit hyperactivity disorder)     Current Outpatient Prescriptions on File Prior to Visit  Medication Sig Dispense Refill  . buPROPion (WELLBUTRIN XL) 150 MG 24 hr tablet Take 3 tablets (450 mg total) by mouth daily. 270 tablet 3  . levocetirizine (XYZAL) 5 MG tablet Take 1 tablet by mouth daily.  6  . montelukast (SINGULAIR) 10 MG tablet Take 1 tablet by mouth daily.  6  . norethindrone-ethinyl estradiol (MICROGESTIN,JUNEL,LOESTRIN) 1-20 MG-MCG tablet Take by mouth.     No current facility-administered medications on file prior to visit.    No Known Allergies  Family History  Problem Relation Age of Onset  . Hyperlipidemia Other     parent  . Hypertension Mother   . Hypertension Father   . Depression Mother   . Depression Maternal Grandmother   . Depression Paternal Grandmother   . Diabetes Mother   . Arthritis Paternal Grandmother   . Breast cancer Maternal Grandmother   . Breast cancer Paternal Grandmother   . Lung cancer Maternal Grandmother   . Hyperlipidemia Other     grandparent  . Heart disease  Other     grandparent  . Diabetes Maternal Grandmother   . Autoimmune disease Brother   . Asthma Brother     Social History   Social History  . Marital Status: Single    Spouse Name: N/A  . Number of Children: N/A  . Years of Education: N/A   Social History Main Topics  . Smoking status: Former Smoker -- 0.25 packs/day    Quit date: 07/09/2013  . Smokeless tobacco: Never Used  . Alcohol Use: Yes     Comment: rare  . Drug Use: Yes    Special: Marijuana  . Sexual Activity: Yes    Birth Control/ Protection: Pill     Comment: men   Other Topics Concern  . None   Social History Narrative   Review of Systems  Constitutional: Negative for fever and weight loss.  HENT: Negative for ear discharge, ear pain, hearing loss and tinnitus.   Eyes: Negative for blurred vision, double vision, photophobia and pain.  Respiratory: Negative for cough and shortness of breath.   Cardiovascular: Negative for chest pain and palpitations.  Gastrointestinal: Negative for heartburn, nausea, vomiting, abdominal pain, diarrhea, constipation, blood in stool and melena.  Genitourinary: Negative for dysuria, urgency, frequency, hematuria and flank pain.  Musculoskeletal: Negative for falls.  Neurological: Negative for dizziness, loss of consciousness and headaches.  Endo/Heme/Allergies: Negative for environmental allergies.  Psychiatric/Behavioral: Negative for  depression, suicidal ideas, hallucinations and substance abuse. The patient is not nervous/anxious and does not have insomnia.    BP 116/70 mmHg  Pulse 102  Temp(Src) 98.4 F (36.9 C) (Oral)  Ht 5\' 1"  (1.549 m)  Wt 135 lb 12.8 oz (61.598 kg)  BMI 25.67 kg/m2  SpO2 98%  LMP 12/17/2014  Physical Exam  Constitutional: She is oriented to person, place, and time and well-developed, well-nourished, and in no distress.  HENT:  Head: Normocephalic and atraumatic.  Right Ear: Tympanic membrane, external ear and ear canal normal.  Left Ear:  Tympanic membrane, external ear and ear canal normal.  Nose: Nose normal. No mucosal edema.  Mouth/Throat: Uvula is midline, oropharynx is clear and moist and mucous membranes are normal. No oropharyngeal exudate or posterior oropharyngeal erythema.  Eyes: Conjunctivae are normal. Pupils are equal, round, and reactive to light.  Neck: Neck supple. No thyromegaly present.  Cardiovascular: Normal rate, regular rhythm, normal heart sounds and intact distal pulses.   Pulmonary/Chest: Effort normal and breath sounds normal. No respiratory distress. She has no wheezes. She has no rales.  Abdominal: Soft. Bowel sounds are normal. She exhibits no distension and no mass. There is no tenderness. There is no rebound and no guarding.  Lymphadenopathy:    She has no cervical adenopathy.  Neurological: She is alert and oriented to person, place, and time. No cranial nerve deficit.  Skin: Skin is warm and dry. No rash noted.  Psychiatric: Affect normal.  Vitals reviewed.  Recent Results (from the past 2160 hour(s))  POCT rapid strep A     Status: Abnormal   Collection Time: 12/18/14  3:54 PM  Result Value Ref Range   Rapid Strep A Screen Positive (A) Negative  POCT rapid strep A     Status: Normal   Collection Time: 01/08/15 11:49 AM  Result Value Ref Range   Rapid Strep A Screen Negative Negative    Assessment/Plan: Visit for preventive health examination Depression screen negative. Health Maintenance reviewed -- flu and tetanus up-to-date. Endorses HIV and Chalmydia screen in 2016 with GYN (normal per patient) Preventive schedule discussed and handout given in AVS. Will obtain fasting labs today.   Migraines None in quite some time. Will attempt a trial at weaning off of Topamax. Will decrease to 50 mg daily for 2 weeks. Patient to follow-up via phone so further instruction can be given.  ADD (attention deficit disorder) Doing very well. Continue current regimen. Meds refilled. Follow-up 6  months.

## 2015-01-21 LAB — CBC
HCT: 43.2 % (ref 36.0–46.0)
Hemoglobin: 14.2 g/dL (ref 12.0–15.0)
MCHC: 33 g/dL (ref 30.0–36.0)
MCV: 88.5 fl (ref 78.0–100.0)
Platelets: 266 10*3/uL (ref 150.0–400.0)
RBC: 4.88 Mil/uL (ref 3.87–5.11)
RDW: 13.8 % (ref 11.5–14.6)
WBC: 7.1 10*3/uL (ref 4.5–10.5)

## 2015-01-21 LAB — BASIC METABOLIC PANEL
BUN: 9 mg/dL (ref 6–23)
CHLORIDE: 111 meq/L (ref 96–112)
CO2: 21 mEq/L (ref 19–32)
Calcium: 9.2 mg/dL (ref 8.4–10.5)
Creatinine, Ser: 0.83 mg/dL (ref 0.40–1.20)
GFR: 92.85 mL/min (ref >60.00)
Glucose, Bld: 73 mg/dL (ref 70–99)
POTASSIUM: 3.7 meq/L (ref 3.5–5.1)
SODIUM: 140 meq/L (ref 135–145)

## 2015-02-11 ENCOUNTER — Encounter: Payer: Self-pay | Admitting: Physician Assistant

## 2015-02-19 ENCOUNTER — Inpatient Hospital Stay (HOSPITAL_COMMUNITY): Payer: 59

## 2015-02-19 ENCOUNTER — Encounter (HOSPITAL_COMMUNITY): Payer: Self-pay | Admitting: Emergency Medicine

## 2015-02-19 ENCOUNTER — Inpatient Hospital Stay (HOSPITAL_COMMUNITY)
Admission: EM | Admit: 2015-02-19 | Discharge: 2015-03-10 | DRG: 917 | Disposition: E | Payer: 59 | Attending: Internal Medicine | Admitting: Internal Medicine

## 2015-02-19 ENCOUNTER — Emergency Department (HOSPITAL_COMMUNITY): Payer: 59

## 2015-02-19 DIAGNOSIS — Z818 Family history of other mental and behavioral disorders: Secondary | ICD-10-CM

## 2015-02-19 DIAGNOSIS — I4581 Long QT syndrome: Secondary | ICD-10-CM | POA: Diagnosis present

## 2015-02-19 DIAGNOSIS — Z66 Do not resuscitate: Secondary | ICD-10-CM | POA: Diagnosis present

## 2015-02-19 DIAGNOSIS — I468 Cardiac arrest due to other underlying condition: Secondary | ICD-10-CM | POA: Diagnosis present

## 2015-02-19 DIAGNOSIS — F17290 Nicotine dependence, other tobacco product, uncomplicated: Secondary | ICD-10-CM | POA: Diagnosis present

## 2015-02-19 DIAGNOSIS — J9600 Acute respiratory failure, unspecified whether with hypoxia or hypercapnia: Secondary | ICD-10-CM | POA: Diagnosis present

## 2015-02-19 DIAGNOSIS — T426X4A Poisoning by other antiepileptic and sedative-hypnotic drugs, undetermined, initial encounter: Secondary | ICD-10-CM | POA: Diagnosis present

## 2015-02-19 DIAGNOSIS — F329 Major depressive disorder, single episode, unspecified: Secondary | ICD-10-CM | POA: Diagnosis present

## 2015-02-19 DIAGNOSIS — F909 Attention-deficit hyperactivity disorder, unspecified type: Secondary | ICD-10-CM | POA: Diagnosis present

## 2015-02-19 DIAGNOSIS — E162 Hypoglycemia, unspecified: Secondary | ICD-10-CM | POA: Diagnosis present

## 2015-02-19 DIAGNOSIS — T424X4A Poisoning by benzodiazepines, undetermined, initial encounter: Secondary | ICD-10-CM | POA: Diagnosis present

## 2015-02-19 DIAGNOSIS — J9601 Acute respiratory failure with hypoxia: Secondary | ICD-10-CM | POA: Insufficient documentation

## 2015-02-19 DIAGNOSIS — F10129 Alcohol abuse with intoxication, unspecified: Secondary | ICD-10-CM | POA: Diagnosis present

## 2015-02-19 DIAGNOSIS — R569 Unspecified convulsions: Secondary | ICD-10-CM | POA: Diagnosis present

## 2015-02-19 DIAGNOSIS — E872 Acidosis, unspecified: Secondary | ICD-10-CM | POA: Insufficient documentation

## 2015-02-19 DIAGNOSIS — T43294A Poisoning by other antidepressants, undetermined, initial encounter: Principal | ICD-10-CM | POA: Diagnosis present

## 2015-02-19 DIAGNOSIS — I959 Hypotension, unspecified: Secondary | ICD-10-CM | POA: Diagnosis present

## 2015-02-19 DIAGNOSIS — Z789 Other specified health status: Secondary | ICD-10-CM

## 2015-02-19 DIAGNOSIS — T50904A Poisoning by unspecified drugs, medicaments and biological substances, undetermined, initial encounter: Secondary | ICD-10-CM | POA: Diagnosis present

## 2015-02-19 DIAGNOSIS — T50902A Poisoning by unspecified drugs, medicaments and biological substances, intentional self-harm, initial encounter: Secondary | ICD-10-CM | POA: Insufficient documentation

## 2015-02-19 DIAGNOSIS — T50901A Poisoning by unspecified drugs, medicaments and biological substances, accidental (unintentional), initial encounter: Secondary | ICD-10-CM | POA: Diagnosis not present

## 2015-02-19 DIAGNOSIS — T39314A Poisoning by propionic acid derivatives, undetermined, initial encounter: Secondary | ICD-10-CM | POA: Diagnosis present

## 2015-02-19 DIAGNOSIS — G43909 Migraine, unspecified, not intractable, without status migrainosus: Secondary | ICD-10-CM | POA: Diagnosis present

## 2015-02-19 DIAGNOSIS — I472 Ventricular tachycardia: Secondary | ICD-10-CM | POA: Diagnosis present

## 2015-02-19 DIAGNOSIS — R680 Hypothermia, not associated with low environmental temperature: Secondary | ICD-10-CM | POA: Diagnosis present

## 2015-02-19 DIAGNOSIS — Z95828 Presence of other vascular implants and grafts: Secondary | ICD-10-CM | POA: Insufficient documentation

## 2015-02-19 LAB — BLOOD GAS, ARTERIAL
ACID-BASE DEFICIT: 9.2 mmol/L — AB (ref 0.0–2.0)
BICARBONATE: 17.3 meq/L — AB (ref 20.0–24.0)
Drawn by: 276051
FIO2: 0.5
LHR: 16 {breaths}/min
O2 Saturation: 98.7 %
PATIENT TEMPERATURE: 98.6
PCO2 ART: 42.5 mmHg (ref 35.0–45.0)
PEEP/CPAP: 5 cmH2O
PO2 ART: 158 mmHg — AB (ref 80.0–100.0)
TCO2: 16.6 mmol/L (ref 0–100)
VT: 350 mL
pH, Arterial: 7.234 — ABNORMAL LOW (ref 7.350–7.450)

## 2015-02-19 LAB — CBC WITH DIFFERENTIAL/PLATELET
BASOS ABS: 0 10*3/uL (ref 0.0–0.1)
Basophils Absolute: 0 10*3/uL (ref 0.0–0.1)
Basophils Relative: 0 %
Basophils Relative: 0 %
EOS ABS: 0 10*3/uL (ref 0.0–0.7)
EOS ABS: 0 10*3/uL (ref 0.0–0.7)
EOS PCT: 0 %
Eosinophils Relative: 0 %
HCT: 30.1 % — ABNORMAL LOW (ref 36.0–46.0)
HEMATOCRIT: 46.5 % — AB (ref 36.0–46.0)
HEMOGLOBIN: 14.5 g/dL (ref 12.0–15.0)
Hemoglobin: 8.9 g/dL — ABNORMAL LOW (ref 12.0–15.0)
LYMPHS ABS: 3.6 10*3/uL (ref 0.7–4.0)
LYMPHS PCT: 35 %
LYMPHS PCT: 70 %
Lymphs Abs: 2.7 10*3/uL (ref 0.7–4.0)
MCH: 29 pg (ref 26.0–34.0)
MCH: 28.4 pg (ref 26.0–34.0)
MCHC: 31.2 g/dL (ref 30.0–36.0)
MCHC: 29.6 g/dL — ABNORMAL LOW (ref 30.0–36.0)
MCV: 93 fL (ref 78.0–100.0)
MCV: 96.2 fL (ref 78.0–100.0)
MONO ABS: 0.2 10*3/uL (ref 0.1–1.0)
Monocytes Absolute: 0.7 10*3/uL (ref 0.1–1.0)
Monocytes Relative: 7 %
Monocytes Relative: 4 %
NEUTROS ABS: 6 10*3/uL (ref 1.7–7.7)
NEUTROS PCT: 58 %
Neutro Abs: 1 10*3/uL — ABNORMAL LOW (ref 1.7–7.7)
Neutrophils Relative %: 26 %
Platelets: 284 10*3/uL (ref 150–400)
Platelets: 219 10*3/uL (ref 150–400)
RBC: 5 MIL/uL (ref 3.87–5.11)
RBC: 3.13 MIL/uL — ABNORMAL LOW (ref 3.87–5.11)
RDW: 13.4 % (ref 11.5–15.5)
RDW: 14 % (ref 11.5–15.5)
WBC: 10.4 10*3/uL (ref 4.0–10.5)
WBC: 3.9 10*3/uL — AB (ref 4.0–10.5)

## 2015-02-19 LAB — MAGNESIUM
Magnesium: 2 mg/dL (ref 1.7–2.4)
Magnesium: 1.4 mg/dL — ABNORMAL LOW (ref 1.7–2.4)

## 2015-02-19 LAB — CBC
HEMATOCRIT: 42.3 % (ref 36.0–46.0)
HEMOGLOBIN: 13.6 g/dL (ref 12.0–15.0)
MCH: 29.9 pg (ref 26.0–34.0)
MCHC: 32.2 g/dL (ref 30.0–36.0)
MCV: 93 fL (ref 78.0–100.0)
Platelets: 251 10*3/uL (ref 150–400)
RBC: 4.55 MIL/uL (ref 3.87–5.11)
RDW: 13.4 % (ref 11.5–15.5)
WBC: 9 10*3/uL (ref 4.0–10.5)

## 2015-02-19 LAB — RAPID URINE DRUG SCREEN, HOSP PERFORMED
AMPHETAMINES: NOT DETECTED
BARBITURATES: NOT DETECTED
BENZODIAZEPINES: NOT DETECTED
COCAINE: NOT DETECTED
Opiates: NOT DETECTED
TETRAHYDROCANNABINOL: NOT DETECTED

## 2015-02-19 LAB — HEPATIC FUNCTION PANEL
ALBUMIN: 3.9 g/dL (ref 3.5–5.0)
ALT: 11 U/L — ABNORMAL LOW (ref 14–54)
AST: 15 U/L (ref 15–41)
Alkaline Phosphatase: 55 U/L (ref 38–126)
Bilirubin, Direct: <0.1 mg/dL — ABNORMAL LOW (ref 0.1–0.5)
TOTAL PROTEIN: 6.5 g/dL (ref 6.5–8.1)
Total Bilirubin: 0.5 mg/dL (ref 0.3–1.2)

## 2015-02-19 LAB — CBG MONITORING, ED
GLUCOSE-CAPILLARY: 88 mg/dL (ref 65–99)
Glucose-Capillary: 80 mg/dL (ref 65–99)
Glucose-Capillary: 58 mg/dL — ABNORMAL LOW (ref 65–99)

## 2015-02-19 LAB — APTT: aPTT: 31 seconds (ref 24–37)

## 2015-02-19 LAB — I-STAT CHEM 8, ED
BUN: 8 mg/dL (ref 6–20)
CALCIUM ION: 1.14 mmol/L (ref 1.12–1.23)
CHLORIDE: 104 mmol/L (ref 101–111)
CREATININE: 0.7 mg/dL (ref 0.44–1.00)
GLUCOSE: 75 mg/dL (ref 65–99)
HCT: 49 % — ABNORMAL HIGH (ref 36.0–46.0)
Hemoglobin: 16.7 g/dL — ABNORMAL HIGH (ref 12.0–15.0)
POTASSIUM: 5 mmol/L (ref 3.5–5.1)
SODIUM: 141 mmol/L (ref 135–145)
TCO2: 25 mmol/L (ref 0–100)

## 2015-02-19 LAB — POCT I-STAT 3, ART BLOOD GAS (G3+)
Acid-base deficit: 8 mmol/L — ABNORMAL HIGH (ref 0.0–2.0)
BICARBONATE: 19.8 meq/L — AB (ref 20.0–24.0)
O2 SAT: 98 %
PCO2 ART: 48.3 mmHg — AB (ref 35.0–45.0)
PO2 ART: 126 mmHg — AB (ref 80.0–100.0)
Patient temperature: 36.1
TCO2: 21 mmol/L (ref 0–100)
pH, Arterial: 7.217 — ABNORMAL LOW (ref 7.350–7.450)

## 2015-02-19 LAB — ACETAMINOPHEN LEVEL: Acetaminophen (Tylenol), Serum: <10 ug/mL — ABNORMAL LOW (ref 10–30)

## 2015-02-19 LAB — LIPASE, BLOOD: Lipase: 24 U/L (ref 11–51)

## 2015-02-19 LAB — PROTIME-INR
INR: 1.04 (ref 0.00–1.49)
Prothrombin Time: 13.4 seconds (ref 11.6–15.2)

## 2015-02-19 LAB — HCG, QUANTITATIVE, PREGNANCY

## 2015-02-19 LAB — I-STAT BETA HCG BLOOD, ED (MC, WL, AP ONLY)

## 2015-02-19 LAB — COMPREHENSIVE METABOLIC PANEL
ALBUMIN: 1.9 g/dL — AB (ref 3.5–5.0)
ALK PHOS: 35 U/L — AB (ref 38–126)
ALT: 50 U/L (ref 14–54)
AST: 59 U/L — AB (ref 15–41)
Anion gap: 7 (ref 5–15)
BILIRUBIN TOTAL: 0.5 mg/dL (ref 0.3–1.2)
CO2: 20 mmol/L — ABNORMAL LOW (ref 22–32)
CREATININE: 0.83 mg/dL (ref 0.44–1.00)
Calcium: 5.4 mg/dL — CL (ref 8.9–10.3)
Chloride: 95 mmol/L — ABNORMAL LOW (ref 101–111)
GFR calc Af Amer: >60 mL/min (ref >60)
GLUCOSE: 791 mg/dL — AB (ref 65–99)
POTASSIUM: 2.8 mmol/L — AB (ref 3.5–5.1)
Sodium: 122 mmol/L — ABNORMAL LOW (ref 135–145)
TOTAL PROTEIN: 3.5 g/dL — AB (ref 6.5–8.1)

## 2015-02-19 LAB — SALICYLATE LEVEL: Salicylate Lvl: <4 mg/dL (ref 2.8–30.0)

## 2015-02-19 LAB — GLUCOSE, CAPILLARY: Glucose-Capillary: 136 mg/dL — ABNORMAL HIGH (ref 65–99)

## 2015-02-19 LAB — AMYLASE: Amylase: 60 U/L (ref 28–100)

## 2015-02-19 LAB — LACTIC ACID, PLASMA: LACTIC ACID, VENOUS: 1.3 mmol/L (ref 0.5–2.0)

## 2015-02-19 LAB — CREATININE, SERUM: CREATININE: 0.7 mg/dL (ref 0.44–1.00)

## 2015-02-19 LAB — MRSA PCR SCREENING: MRSA BY PCR: NEGATIVE

## 2015-02-19 LAB — ETHANOL: Alcohol, Ethyl (B): <5 mg/dL (ref <5)

## 2015-02-19 MED ORDER — INSULIN ASPART 100 UNIT/ML ~~LOC~~ SOLN: 0.0000 [IU] | SUBCUTANEOUS | Status: DC

## 2015-02-19 MED ORDER — FENTANYL CITRATE (PF) 100 MCG/2ML IJ SOLN: 100.0000 ug | INTRAMUSCULAR | Status: DC | PRN

## 2015-02-19 MED ORDER — MIDAZOLAM HCL 2 MG/2ML IJ SOLN
INTRAMUSCULAR | Status: AC
  Filled 2015-02-19: qty 2

## 2015-02-19 MED ORDER — MIDAZOLAM HCL 2 MG/2ML IJ SOLN
2.0000 mg | INTRAMUSCULAR | Status: DC | PRN
  Filled 2015-02-19: qty 2

## 2015-02-19 MED ORDER — DEXTROSE 50 % IV SOLN
INTRAVENOUS | Status: AC
  Filled 2015-02-19: qty 50

## 2015-02-19 MED ORDER — LORAZEPAM 2 MG/ML IJ SOLN
INTRAMUSCULAR | Status: AC
  Filled 2015-02-19: qty 1

## 2015-02-19 MED ORDER — SODIUM BICARBONATE 8.4 % IV SOLN
INTRAVENOUS | Status: AC
  Filled 2015-02-19: qty 50

## 2015-02-19 MED ORDER — ONDANSETRON HCL 4 MG/2ML IJ SOLN
4.0000 mg | Freq: Once | INTRAMUSCULAR | Status: AC
  Administered 2015-02-19: 4 mg via INTRAVENOUS
  Filled 2015-02-19: qty 2

## 2015-02-19 MED ORDER — FENTANYL CITRATE (PF) 100 MCG/2ML IJ SOLN
INTRAMUSCULAR | Status: DC | PRN
  Administered 2015-02-19: 100 ug via INTRAVENOUS

## 2015-02-19 MED ORDER — DEXTROSE 50 % IV SOLN
1.0000 | Freq: Once | INTRAVENOUS | Status: AC
  Administered 2015-02-19: 50 mL via INTRAVENOUS

## 2015-02-19 MED ORDER — PROSIGHT PO TABS: 1.0000 | ORAL_TABLET | Freq: Every day | ORAL | Status: DC

## 2015-02-19 MED ORDER — LEVETIRACETAM 500 MG/5ML IV SOLN
1000.0000 mg | Freq: Once | INTRAVENOUS | Status: AC
  Administered 2015-02-19: 1000 mg via INTRAVENOUS
  Filled 2015-02-19: qty 10

## 2015-02-19 MED ORDER — FENTANYL CITRATE (PF) 100 MCG/2ML IJ SOLN
INTRAMUSCULAR | Status: AC
  Filled 2015-02-19: qty 2

## 2015-02-19 MED ORDER — LORAZEPAM 2 MG/ML IJ SOLN
INTRAMUSCULAR | Status: AC
  Administered 2015-02-19: 1 mg
  Filled 2015-02-19: qty 1

## 2015-02-19 MED ORDER — SODIUM CHLORIDE 0.9 % IV BOLUS (SEPSIS)
1000.0000 mL | Freq: Once | INTRAVENOUS | Status: AC
  Administered 2015-02-19: 1000 mL via INTRAVENOUS

## 2015-02-19 MED ORDER — MIDAZOLAM HCL 2 MG/2ML IJ SOLN
2.0000 mg | INTRAMUSCULAR | Status: DC | PRN
  Administered 2015-02-19: 1 mg via INTRAVENOUS

## 2015-02-19 MED ORDER — FOLIC ACID 1 MG PO TABS: 1.0000 mg | ORAL_TABLET | Freq: Every day | ORAL | Status: DC

## 2015-02-19 MED ORDER — SODIUM BICARBONATE 8.4 % IV SOLN
INTRAVENOUS | Status: DC
  Administered 2015-02-19: 13:00:00 via INTRAVENOUS
  Filled 2015-02-19: qty 150

## 2015-02-19 MED ORDER — HEPARIN SODIUM (PORCINE) 5000 UNIT/ML IJ SOLN
5000.0000 [IU] | Freq: Three times a day (TID) | INTRAMUSCULAR | Status: DC
  Filled 2015-02-19 (×3): qty 1

## 2015-02-19 MED ORDER — FAMOTIDINE 20 MG PO TABS: 20.0000 mg | ORAL_TABLET | Freq: Two times a day (BID) | ORAL | Status: DC

## 2015-02-19 MED ORDER — SODIUM CHLORIDE 0.9 % IV SOLN: 250.0000 mL | INTRAVENOUS | Status: DC | PRN

## 2015-02-19 MED ORDER — LORAZEPAM 2 MG/ML IJ SOLN
INTRAMUSCULAR | Status: AC
  Administered 2015-02-19: 2 mg
  Filled 2015-02-19: qty 1

## 2015-02-19 MED ORDER — VITAMIN B-1 100 MG PO TABS: 100.0000 mg | ORAL_TABLET | Freq: Every day | ORAL | Status: DC

## 2015-02-19 MED ORDER — LORAZEPAM 2 MG/ML IJ SOLN
2.0000 mg | Freq: Once | INTRAMUSCULAR | Status: AC
  Administered 2015-02-19: 2 mg via INTRAVENOUS

## 2015-02-19 MED ORDER — AMIODARONE HCL IN DEXTROSE 360-4.14 MG/200ML-% IV SOLN
INTRAVENOUS | Status: AC
  Filled 2015-02-19: qty 200

## 2015-02-19 MED ORDER — ONDANSETRON HCL 4 MG/2ML IJ SOLN: 4.0000 mg | Freq: Four times a day (QID) | INTRAMUSCULAR | Status: DC | PRN

## 2015-02-19 MED ORDER — PANTOPRAZOLE SODIUM 40 MG IV SOLR
40.0000 mg | INTRAVENOUS | Status: DC
  Administered 2015-02-19: 40 mg via INTRAVENOUS
  Filled 2015-02-19: qty 40

## 2015-02-19 MED ORDER — ETOMIDATE 2 MG/ML IV SOLN
INTRAVENOUS | Status: DC | PRN
  Administered 2015-02-19: 100 mg via INTRAVENOUS

## 2015-02-19 MED ORDER — ANTISEPTIC ORAL RINSE SOLUTION (CORINZ): 7.0000 mL | Freq: Four times a day (QID) | OROMUCOSAL | Status: DC

## 2015-02-19 MED ORDER — SODIUM CHLORIDE 0.9 % IV SOLN
INTRAVENOUS | Status: DC
  Administered 2015-02-19: 12:00:00 via INTRAVENOUS

## 2015-02-19 MED ORDER — SODIUM BICARBONATE 8.4 % IV SOLN
50.0000 meq | Freq: Once | INTRAVENOUS | Status: AC
  Administered 2015-02-19: 50 meq via INTRAVENOUS

## 2015-02-19 MED ORDER — MIDAZOLAM HCL 5 MG/ML IJ SOLN
1.0000 mg/h | INTRAMUSCULAR | Status: DC
  Administered 2015-02-19: 1 mg/h via INTRAVENOUS
  Filled 2015-02-19: qty 10

## 2015-02-19 MED ORDER — DEXTROSE 5 % IV SOLN
30.0000 ug/min | INTRAVENOUS | Status: DC
  Administered 2015-02-19: 75 ug/min via INTRAVENOUS
  Administered 2015-02-19: 80 ug/min via INTRAVENOUS
  Filled 2015-02-19 (×3): qty 1

## 2015-02-19 MED ORDER — EPINEPHRINE HCL 0.1 MG/ML IJ SOSY
PREFILLED_SYRINGE | INTRAMUSCULAR | Status: AC
  Filled 2015-02-19: qty 30

## 2015-02-19 MED ORDER — ACTIDOSE WITH SORBITOL 50 GM/240ML PO LIQD
50.0000 g | Freq: Once | ORAL | Status: AC
Start: 2015-02-19 — End: 2015-02-19
  Administered 2015-02-19: 50 g via ORAL
  Filled 2015-02-19: qty 240

## 2015-02-19 MED ORDER — BISACODYL 10 MG RE SUPP: 10.0000 mg | Freq: Every day | RECTAL | Status: DC | PRN

## 2015-02-19 MED ORDER — PHENYLEPHRINE HCL 10 MG/ML IJ SOLN
30.0000 ug/min | INTRAVENOUS | Status: DC
  Filled 2015-02-19 (×2): qty 4

## 2015-02-19 MED ORDER — CHLORHEXIDINE GLUCONATE 0.12% ORAL RINSE (MEDLINE KIT): 15.0000 mL | Freq: Two times a day (BID) | OROMUCOSAL | Status: DC

## 2015-02-19 MED ORDER — SODIUM BICARBONATE 8.4 % IV SOLN
INTRAVENOUS | Status: AC
  Filled 2015-02-19: qty 100

## 2015-02-19 MED ORDER — SODIUM CHLORIDE 0.9 % IV SOLN
500.0000 mg | Freq: Two times a day (BID) | INTRAVENOUS | Status: DC
  Filled 2015-02-19: qty 5

## 2015-02-19 MED ORDER — SODIUM CHLORIDE 0.9 % IV BOLUS (SEPSIS)
2000.0000 mL | Freq: Once | INTRAVENOUS | Status: AC
  Administered 2015-02-19: 2000 mL via INTRAVENOUS

## 2015-02-20 MED FILL — Medication: Qty: 1 | Status: AC

## 2015-02-23 DIAGNOSIS — Z789 Other specified health status: Secondary | ICD-10-CM | POA: Insufficient documentation

## 2015-02-23 DIAGNOSIS — R569 Unspecified convulsions: Secondary | ICD-10-CM | POA: Insufficient documentation

## 2015-03-10 NOTE — ED Notes (Signed)
2 mg versed handed off to ICU nurse Erin d/t patient having seizure like activity when arriving to ICU. Intensivist team in the room at this time evaluating patient.

## 2015-03-10 NOTE — Progress Notes (Signed)
   03-07-15 2000  Clinical Encounter Type  Visited With Family;Health care provider  Visit Type Follow-up;Death;Psychological support  Referral From Nurse  Spiritual Encounters  Spiritual Needs Emotional;Grief support   Chaplain responded to patient's death. Per family's request, chaplain offered support for patient's friends. Chaplain services available as needed.   Alda Ponder, Chaplain 03/07/15

## 2015-03-10 NOTE — Progress Notes (Signed)
1724 pt's BP started dropping and irregular heart rhythm noted.  1725  CPR started and continued.(See CPR Record for sequence of events.) At 1820, Dr. Craige Cotta updated the family and they decided to make her a DNR. At 1841, pt went asystole. Harlan Stains, RN and myself listened for heart sounds, none was auscultated.  Family is at the bedside.  Chaplain present.  CDS was notified of TOD.

## 2015-03-10 NOTE — ED Notes (Signed)
Patients mother and father have been contacted by Press photographer. Both parents expected to arrive shortly.

## 2015-03-10 NOTE — Procedures (Signed)
Intubation Procedure Note Yolanda Phelps [id number] 1995-09-04  Procedure: Intubation Indications: Airway protection and maintenance  Procedure Details Consent: Risks of procedure as well as the alternatives and risks of each were explained to the (patient/caregiver).  Consent for procedure obtained. and Unable to obtain consent because of altered level of consciousness. Time Out: Verified patient identification, verified procedure, site/side was marked, verified correct patient position, special equipment/implants available, medications/allergies/relevent history reviewed, required imaging and test results available.  Performed  Maximum sterile technique was used including gloves, hand hygiene and mask.  MAC and 4    Evaluation Hemodynamic Status: BP stable throughout; O2 sats: stable throughout Patient's Current Condition: stable Complications: No apparent complications Patient did tolerate procedure well. Chest X-ray ordered to verify placement.  CXR: pending.   ALVA,RAKESH V. 02/21/2015

## 2015-03-10 NOTE — ED Notes (Signed)
Pt presents with increased agitation and confusion. EDP Madilyn Hook present to witness behavior. Order for ativan 1 mg IVP. Charge Occupational hygienist present to assist.

## 2015-03-10 NOTE — ED Provider Notes (Addendum)
CSN: [id number]     Arrival date & time 03/03/15  0449 History   First MD Initiated Contact with Patient Mar 03, 2015 0505     Chief Complaint  Patient presents with  . Ingestion     (Consider location/radiation/quality/duration/timing/severity/associated sxs/prior Treatment) Patient is a 20 y.o. female presenting with Ingested Medication and Overdose. The history is provided by the patient.  Ingestion This is a new problem. The problem occurs constantly. The problem has not changed since onset.Pertinent negatives include no chest pain, no abdominal pain, no headaches and no shortness of breath. Nothing aggravates the symptoms. Nothing relieves the symptoms. She has tried nothing for the symptoms. The treatment provided no relief.  Drug Overdose This is a new problem. The current episode started 1 to 2 hours ago. The problem occurs constantly. The problem has not changed since onset.Pertinent negatives include no chest pain, no abdominal pain, no headaches and no shortness of breath. Nothing aggravates the symptoms. Nothing relieves the symptoms. She has tried nothing for the symptoms. The treatment provided no relief.  30 wellbutrin 150 mg, 2 xanax, 2 ibuprofen, and 4-6 topamax and 4 shots of alcohol  Past Medical History  Diagnosis Date  . Childhood asthma   . Migraines   . Depression   . Immune deficiency disorder (HCC)     pt not with much info avail, says brother has same defect but is worse and has to have IgG infusions  . ADHD (attention deficit hyperactivity disorder)    Past Surgical History  Procedure Laterality Date  . Adenoidectomy  1997  . Wisdom tooth extraction  2014  . Myringotomy     Family History  Problem Relation Age of Onset  . Hyperlipidemia Other     parent  . Hypertension Mother   . Hypertension Father   . Depression Mother   . Depression Maternal Grandmother   . Depression Paternal Grandmother   . Diabetes Mother   . Arthritis Paternal Grandmother   .  Breast cancer Maternal Grandmother   . Breast cancer Paternal Grandmother   . Lung cancer Maternal Grandmother   . Hyperlipidemia Other     grandparent  . Heart disease Other     grandparent  . Diabetes Maternal Grandmother   . Autoimmune disease Brother   . Asthma Brother    Social History  Substance Use Topics  . Smoking status: Current Every Day Smoker -- 0.25 packs/day    Last Attempt to Quit: 07/09/2013  . Smokeless tobacco: Never Used  . Alcohol Use: Yes     Comment: rare   OB History    No data available     Review of Systems  Respiratory: Negative for shortness of breath.   Cardiovascular: Negative for chest pain.  Gastrointestinal: Negative for abdominal pain.  Neurological: Negative for headaches.  All other systems reviewed and are negative.     Allergies  Review of patient's allergies indicates no known allergies.  Home Medications   Prior to Admission medications   Medication Sig Start Date End Date Taking? Authorizing Provider  azelastine (ASTELIN) 0.1 % nasal spray Place 2 sprays into both nostrils 2 (two) times daily. Use in each nostril as directed 01/20/15  Yes Waldon Merl, PA-C  buPROPion (WELLBUTRIN XL) 150 MG 24 hr tablet Take 3 tablets (450 mg total) by mouth daily. 04/14/14  Yes Waldon Merl, PA-C  ibuprofen (ADVIL,MOTRIN) 200 MG tablet Take 400 mg by mouth every 6 (six) hours as needed for moderate pain.  Yes Historical Provider, MD  levocetirizine (XYZAL) 5 MG tablet Take 1 tablet by mouth daily. 05/29/14  Yes Historical Provider, MD  lisdexamfetamine (VYVANSE) 40 MG capsule Take 1 capsule (40 mg total) by mouth every morning. 01/20/15  Yes Waldon Merl, PA-C  montelukast (SINGULAIR) 10 MG tablet Take 1 tablet by mouth daily. 05/29/14  Yes Historical Provider, MD  norethindrone-ethinyl estradiol (MICROGESTIN,JUNEL,LOESTRIN) 1-20 MG-MCG tablet Take by mouth. 08/24/14 08/24/15 Yes Historical Provider, MD  topiramate (TOPAMAX) 100 MG tablet  Take 0.5 tablets (50 mg total) by mouth daily. Patient taking differently: Take 100 mg by mouth daily.  01/20/15  Yes Waldon Merl, PA-C   BP 119/87 mmHg  Pulse 122  Temp(Src) 98.1 F (36.7 C) (Oral)  Resp 23  SpO2 100%  LMP 01/17/2015 (Approximate) Physical Exam  Constitutional: She is oriented to person, place, and time. She appears well-developed and well-nourished. No distress.  HENT:  Head: Normocephalic and atraumatic.  Mouth/Throat: Oropharynx is clear and moist.  Eyes: Conjunctivae are normal. Pupils are equal, round, and reactive to light.  Neck: Normal range of motion. Neck supple.  Cardiovascular: Regular rhythm and intact distal pulses.  Tachycardia present.   Pulmonary/Chest: Effort normal and breath sounds normal. She has no wheezes. She has no rales.  Abdominal: Soft. Bowel sounds are normal. There is no tenderness. There is no rebound and no guarding.  Musculoskeletal: Normal range of motion. She exhibits no edema or tenderness.  Neurological: She is alert and oriented to person, place, and time. She has normal reflexes.  Skin: Skin is warm and dry.  Psychiatric: She expresses suicidal ideation.    ED Course  Procedures (including critical care time) Labs Review Labs Reviewed  CBC WITH DIFFERENTIAL/PLATELET - Abnormal; Notable for the following:    HCT 46.5 (*)    All other components within normal limits  I-STAT CHEM 8, ED - Abnormal; Notable for the following:    Hemoglobin 16.7 (*)    HCT 49.0 (*)    All other components within normal limits  URINE RAPID DRUG SCREEN, HOSP PERFORMED  ACETAMINOPHEN LEVEL  ETHANOL  SALICYLATE LEVEL  I-STAT BETA HCG BLOOD, ED ([facility], [facility], [facility] ONLY)    Imaging Review Dg Chest Portable 1 View  03-16-15  CLINICAL DATA:  Overdose last night. Occasional smoker. Immune deficiency disorder. EXAM: PORTABLE CHEST 1 VIEW COMPARISON:  Multiple exams, including 01/16/2010 FINDINGS: The heart size and mediastinal contours are within  normal limits. Both lungs are clear. The visualized skeletal structures are unremarkable. IMPRESSION: No active disease. Electronically Signed   By: Gaylyn Rong M.D.   On: Mar 16, 2015 06:40   I have personally reviewed and evaluated these images and lab results as part of my medical decision-making.   EKG Interpretation None      MDM   Final diagnoses:  Intentional drug overdose, initial encounter (HCC)    Medications  sodium chloride 0.9 % bolus 2,000 mL (not administered)  activated charcoal-sorbitol (ACTIDOSE-SORBITOL) suspension 50 g (50 g Oral Given 03/16/15 0541)  sodium chloride 0.9 % bolus 1,000 mL (1,000 mLs Intravenous New Bag/Given 03/16/2015 0541)  ondansetron (ZOFRAN) injection 4 mg (4 mg Intravenous Given Mar 16, 2015 0541)   Results for orders placed or performed during the hospital encounter of 16-Mar-2015  CBC with Differential/Platelet  Result Value Ref Range   WBC 10.4 4.0 - 10.5 K/uL   RBC 5.00 3.87 - 5.11 MIL/uL   Hemoglobin 14.5 12.0 - 15.0 g/dL   HCT 16.1 (H) 09.6 - 04.5 %  MCV 93.0 78.0 - 100.0 fL   MCH 29.0 26.0 - 34.0 pg   MCHC 31.2 30.0 - 36.0 g/dL   RDW 40.9 81.1 - 91.4 %   Platelets 284 150 - 400 K/uL   Neutrophils Relative % 58 %   Neutro Abs 6.0 1.7 - 7.7 K/uL   Lymphocytes Relative 35 %   Lymphs Abs 3.6 0.7 - 4.0 K/uL   Monocytes Relative 7 %   Monocytes Absolute 0.7 0.1 - 1.0 K/uL   Eosinophils Relative 0 %   Eosinophils Absolute 0.0 0.0 - 0.7 K/uL   Basophils Relative 0 %   Basophils Absolute 0.0 0.0 - 0.1 K/uL  Acetaminophen level  Result Value Ref Range   Acetaminophen (Tylenol), Serum <10 (L) 10 - 30 ug/mL  Ethanol  Result Value Ref Range   Alcohol, Ethyl (B) <5 <5 mg/dL  Salicylate level  Result Value Ref Range   Salicylate Lvl <4.0 2.8 - 30.0 mg/dL  I-stat chem 8, ed  Result Value Ref Range   Sodium 141 135 - 145 mmol/L   Potassium 5.0 3.5 - 5.1 mmol/L   Chloride 104 101 - 111 mmol/L   BUN 8 6 - 20 mg/dL   Creatinine, Ser  7.82 0.44 - 1.00 mg/dL   Glucose, Bld 75 65 - 99 mg/dL   Calcium, Ion 9.56 2.13 - 1.23 mmol/L   TCO2 25 0 - 100 mmol/L   Hemoglobin 16.7 (H) 12.0 - 15.0 g/dL   HCT 08.6 (H) 57.8 - 46.9 %  I-Stat Beta hCG blood, ED ([facility], [facility], [facility] only)  Result Value Ref Range   I-stat hCG, quantitative <5.0 <5 mIU/mL   Comment 3           Dg Chest Portable 1 View  03/13/2015  CLINICAL DATA:  Overdose last night. Occasional smoker. Immune deficiency disorder. EXAM: PORTABLE CHEST 1 VIEW COMPARISON:  Multiple exams, including 01/16/2010 FINDINGS: The heart size and mediastinal contours are within normal limits. Both lungs are clear. The visualized skeletal structures are unremarkable. IMPRESSION: No active disease. Electronically Signed   By: Gaylyn Rong M.D.   On: 13-Mar-2015 06:40    MDM Reviewed: previous chart, nursing note and vitals Interpretation: labs, ECG and x-ray (cxr normal by me negative tylenol and salicylate) Total time providing critical care: 75-105 minutes. This excludes time spent performing separately reportable procedures and services. Consults: critical care (poison control)  CRITICAL CARE Performed by: Jasmine Awe Total critical care time: 90 minutes Critical care time was exclusive of separately billable procedures and treating other patients. Critical care was necessary to treat or prevent imminent or life-threatening deterioration. Critical care was time spent personally by me on the following activities: development of treatment plan with patient and/or surrogate as well as nursing, discussions with consultants, evaluation of patient's response to treatment, examination of patient, obtaining history from patient or surrogate, ordering and performing treatments and interventions, ordering and review of laboratory studies, ordering and review of radiographic studies, pulse oximetry and re-evaluation of patient's condition.     Will need inpatient psychiatric treatment  as this was a serious overdose, when medically clear which will be at least 24 hours   Daishaun Ayre, MD 2015-03-13 [id number]  Cy Blamer, MD March 13, 2015 [phone]

## 2015-03-10 NOTE — Procedures (Signed)
Central Venous Catheter Insertion Procedure Note Yolanda Phelps [id number] 1995-11-29  Procedure: Insertion of Central Venous Catheter Indications: Assessment of intravascular volume, Drug and/or fluid administration and Frequent blood sampling  Procedure Details Consent: Risks of procedure as well as the alternatives and risks of each were explained to the (patient/caregiver).  Consent for procedure obtained.   Time Out: Verified patient identification, verified procedure, site/side was marked, verified correct patient position, special equipment/implants available, medications/allergies/relevent history reviewed, required imaging and test results available.  Performed  Maximum sterile technique was used including antiseptics, cap, gloves, gown, hand hygiene, mask and sheet. Skin prep: Chlorhexidine; local anesthetic administered A antimicrobial bonded/coated triple lumen catheter was placed in the left internal jugular vein using the Seldinger technique.  Evaluation Blood flow good Complications: No apparent complications Patient did tolerate procedure well. Chest X-ray ordered to verify placement.  CXR: normal.    Procedure performed under direct supervision of Dr. Vassie Loll and with ultrasound guidance for real time vessel cannulation.      Yolanda Brim, NP-C [facility] Pgr: [phone] or if no answer [phone] 03/20/15, 2:27 PM

## 2015-03-10 NOTE — ED Notes (Signed)
Seizure pads were placed on bed at 0800. At this time, patient was alert and oriented x 4, able to walk without gait abnormality. At 0815 patient began having what appeared to be a grand mal seizure, was given 2 mg Ativan IV during, seizure lasted 30 seconds, then began having a mild zoning out seizure like activity that lasted a couple minutes. Until ativan kicked in and patient began sleeping.

## 2015-03-10 NOTE — Discharge Summary (Signed)
PULMONARY / CRITICAL CARE MEDICINE   Name: Yolanda Phelps MRN: [id number] DOB: [birth year]    ADMISSION DATE:  2015-03-17 CONSULTATION DATE:  03/17/2015  REFERRING MD:  Dr. Nicanor Alcon   CHIEF COMPLAINT:  Overdose, seizure/AMS  20 y/o F with PMH of prior suicide attempt admitted 2/10 with polysubstance overdose (wellbutrin, xanax, etoh).  Later developed seizure and altered mental status / somnolence requiring intubation.       ASSESSMENT / PLAN:  NEUROLOGIC A:   Polysubstance Overdose - unclear if intentional OD.  Patient told the staff she "just didn't want to feel pain anymore". She also apparently was posting on social media about her overdose.  CT head negative.   Hx Prior Suicide Attempt  P:   RASS goal: 0 PRN fentanyl / versed for pain & sedation/seizures   PULMONARY A: Intubated for Airway Protection - secondary to polysubstance overdose  P:  PRVC 8 cc/kg    CARDIOVASCULAR A:  Mild Hypotension - thought to be sedation related + overdose At Risk Prolonged QTc P:  S/P 4L NS bolus NS @ 125 ml/hr May require vasopressors  EKG Q4 for now  RENAL  A:  Metabolic Acidosis - secondary to overdose, lactate negative  P:  Replace electrolytes as indicated  Add Bicarbonate gtt   GASTROINTESTINAL A:   At Risk Aspiration S/P Activated Charcoal  P:   NPO  OGT to LIS  PPI   COURSE : Central line was inserted and pressors were initiated .She was transferred to [facility] from [place] arrival due to poor neuro exam and concern for ongoing seizures for continuous EEG monitoring. She was evaluated by neurology. She developed pulseless V. tach and initially was successfully resuscitated . But rhythm deteriorated again and she required prolonged CPR and ACLS. Subsequently made DO NOT RESUSCITATE after discussion with family. No cause of death assigned since this was an overdose until case cleared by medical examiner  Yolanda Phelps. MD   02/26/2015, 11:05 AM

## 2015-03-10 NOTE — Progress Notes (Signed)
S:  Called to bedside for hypotension, hypothermia and seizure  O:  Blood pressure 80/55, pulse 97, temperature 97.8 F (36.6 C), temperature source Axillary, resp. rate 19, height  (1.499 m), weight 159 lb 2.8 oz (72.2 kg), last menstrual period 01/17/2015, SpO2 92 %.  General: well developed young adult on vent  Neuro: obtunded, no response to deep painful stimuli, spontaneous seizure-like activity with flexion of feet, decorticate posturing (arms drawn to chest), pupils 5-16mm non-responsive, no gag CV: s1s2 rrr, no m/r/g PULM: even/non-labored on vent, lungs bilaterally clear  GI: round/soft, bsx4 active  Extremities: cool/dry   A: Hypothermia  Hypotension Metabolic Acidosis  Prolonged QTc  Intermittent Seizures   P: Transfer to [facility] for continuous EEG Increase rate on vent to 24 Repeat ABG at 1500 with bicarb gtt and rate change  Versed gtt in setting of seizure activity  Load Keppra, then BID Neosynephrine for MAP >65  Neurology consult  Place central venous access  Add Bair Hugger for normothermia Increase EKG to Q1 hour per poison control recommendations   Updated parents at bedside, they also report she may have taken Vyvanse in addition to other known meds.    CC Time:  35 minutes    [name], NP-C [facility] Pgr: [phone] or if no answer [phone] 03/08/2015, 1:29 PM

## 2015-03-10 NOTE — ED Notes (Signed)
Magnesium levels and supportive care

## 2015-03-10 NOTE — Procedures (Signed)
ELECTROENCEPHALOGRAM REPORT   Patient: Yolanda Phelps       Room #: 3NM-01 Age: 19 y.o.        Sex: female Referring Physician: Dr Vassie Loll Report Date:  16-Mar-2015        Interpreting Physician: Omelia Blackwater  History: Yolanda Phelps is an 20 y.o. female seizure activity after drug overdose  Medications:  I have reviewed the patient's current medications.  Conditions of Recording:  This is a 16 channel EEG carried out with the patient in the intubated and unresponsive state.  Description:  The background consists of a markedly suppressed activity. Throughout the recording their are frequent episodes of generalized activity which is followed by suppressed background activity. These episodes are associated with clinical generalized myoclonic activity. These episodes or electrographic episodes did not attenuate with the addition of ativan. No response to noxious stimuli is noted.  Hyperventilation was not performed. Intermittent photic stimulation was not performed.   IMPRESSION: 1)Abnormal EEG due to the presence of a markedly suppressed background rhythm that is interspersed with short bursts of electrographic activity. This is consistent with a burst suppression pattern.   2)Frequent myoclonic jerks are noted throughout the study. These do not appear to have an electrographic correlate   Elspeth Cho, DO [facility] [phone]  If 7pm- 7am, please page neurology on call as listed in AMION. 03/16/2015, 6:59 PM

## 2015-03-10 NOTE — ED Notes (Signed)
Pt states she took 2 Vyvanse  each

## 2015-03-10 NOTE — H&P (Addendum)
Name: Yolanda Phelps MRN: [id number] DOB: 13-Sep-1995    ADMISSION DATE:  02/26/2015 CONSULTATION DATE: 02-26-2015   REFERRING MD :  Nicanor Alcon, MD  CHIEF COMPLAINT:  OD  BRIEF PATIENT DESCRIPTION: 20 yr old OD Wellbutrin  SIGNIFICANT EVENTS    STUDIES:    HISTORY OF PRESENT ILLNESS: 20 yr old Pt h/o of suicide attempt in 9th grade. she took Xanax 1 tab strength unknown, Wellbutrin XL 150 mg 30 tabs about an hour ago, Topamax  4 tabs and earlier tonight she took ibuprofen 2 tabs for a headache Pt states she has also had 4 shots of vodka earlier tonight denies trying suicide. Pt has hx of depression. Pt states all medication was hers except for the xanax . No cp, n/v/d,. Got charcoal in ED. Tachy mild. No fevers. No odd movement. QRS WNL. Asked to admit pt. POison control was called. She stated that she has been depressed about school.   PAST MEDICAL HISTORY :   has a past medical history of Childhood asthma; Migraines; Depression; Immune deficiency disorder (HCC); and ADHD (attention deficit hyperactivity disorder).  has past surgical history that includes Adenoidectomy (1997); Wisdom tooth extraction (2014); and Myringotomy. Prior to Admission medications   Medication Sig Start Date End Date Taking? Authorizing Provider  azelastine (ASTELIN) 0.1 % nasal spray Place 2 sprays into both nostrils 2 (two) times daily. Use in each nostril as directed 01/20/15  Yes Waldon Merl, PA-C  buPROPion (WELLBUTRIN XL) 150 MG 24 hr tablet Take 3 tablets (450 mg total) by mouth daily. 04/14/14  Yes Waldon Merl, PA-C  ibuprofen (ADVIL,MOTRIN) 200 MG tablet Take 400 mg by mouth every 6 (six) hours as needed for moderate pain.   Yes Historical Provider, MD  levocetirizine (XYZAL) 5 MG tablet Take 1 tablet by mouth daily. 05/29/14  Yes Historical Provider, MD  lisdexamfetamine (VYVANSE) 40 MG capsule Take 1 capsule (40 mg total) by mouth every morning. 01/20/15  Yes Waldon Merl, PA-C    montelukast (SINGULAIR) 10 MG tablet Take 1 tablet by mouth daily. 05/29/14  Yes Historical Provider, MD  norethindrone-ethinyl estradiol (MICROGESTIN,JUNEL,LOESTRIN) 1-20 MG-MCG tablet Take by mouth. 08/24/14 08/24/15 Yes Historical Provider, MD  topiramate (TOPAMAX) 100 MG tablet Take 0.5 tablets (50 mg total) by mouth daily. Patient taking differently: Take 100 mg by mouth daily.  01/20/15  Yes Waldon Merl, PA-C   No Known Allergies  FAMILY HISTORY:  family history includes Arthritis in her paternal grandmother; Asthma in her brother; Autoimmune disease in her brother; Breast cancer in her maternal grandmother and paternal grandmother; Depression in her maternal grandmother, mother, and paternal grandmother; Diabetes in her maternal grandmother and mother; Heart disease in her other; Hyperlipidemia in her other and other; Hypertension in her father and mother; Lung cancer in her maternal grandmother. SOCIAL HISTORY:  reports that she has been smoking.  She has never used smokeless tobacco. She reports that she drinks alcohol. She reports that she does not use illicit drugs.  REVIEW OF SYSTEMS:   Constitutional: Negative for fever, chills, weight loss, malaise/fatigue and diaphoresis.  HENT: Negative for hearing loss, ear pain, nosebleeds, congestion, sore throat, neck pain, tinnitus and ear discharge.   Eyes: Negative for blurred vision, double vision, photophobia, pain, discharge and redness.  Respiratory: Negative for cough, hemoptysis, sputum production, shortness of breath, wheezing and stridor.   Cardiovascular: Negative for chest pain, palpitations, orthopnea, claudication, leg swelling and PND.  Gastrointestinal: Negative for heartburn, nausea, vomiting, abdominal pain,  diarrhea, constipation, blood in stool and melena.  Genitourinary: Negative for dysuria, urgency, frequency, hematuria and flank pain.  Musculoskeletal: Negative for myalgias, back pain, joint pain and falls.  Skin:  Negative for itching and rash.  Neurological: no loc, headache resolved Endo/Heme/Allergies: Negative for environmental allergies and polydipsia. Does not bruise/bleed easily.  SUBJECTIVE: no distress  VITAL SIGNS: Temp:  [98.1 F (36.7 C)] 98.1 F (36.7 C) (02/10 0458) Pulse Rate:  [110-143] 122 (02/10 0645) Resp:  [20-35] 23 (02/10 0645) BP: (119-131)/(80-93) 119/87 mmHg (02/10 0645) SpO2:  [98 %-100 %] 100 % (02/10 0645)  PHYSICAL EXAMINATION: General:  Mild anxiety Neuro:  Perr, nonfocal, alert, texting HEENT:  jvd wnl Cardiovascular:  s1 s2 RRT no m Lungs:  CTA Abdomen:  Soft, BS hypo, NT, nd Musculoskeletal:  No edema Skin:  No rash   Recent Labs Lab 03/11/2015 0520  NA 141  K 5.0  CL 104  BUN 8  CREATININE 0.70  GLUCOSE 75    Recent Labs Lab March 11, 2015 0512 11-Mar-2015 0520  HGB 14.5 16.7*  HCT 46.5* 49.0*  WBC 10.4  --   PLT 284  --    Dg Chest Portable 1 View  Mar 11, 2015  CLINICAL DATA:  Overdose last night. Occasional smoker. Immune deficiency disorder. EXAM: PORTABLE CHEST 1 VIEW COMPARISON:  Multiple exams, including 01/16/2010 FINDINGS: The heart size and mediastinal contours are within normal limits. Both lungs are clear. The visualized skeletal structures are unremarkable. IMPRESSION: No active disease. Electronically Signed   By: Gaylyn Rong M.D.   On: 03/11/2015 06:40    ASSESSMENT / PLAN:  Polysubstance OD, primary concern Wellbutrin At risk Seziures Etoh intoxication Sinus tachy Suicide attempt Depression Hemoconcentration  -hydrate -admit to icu -will need psych consult -suicide precautions, seizure precautions -ensure pregnancy test -tele needed -chem in am  -thiamine, folic, mvi  -may need ativan PRN -frequent 12 lead ecg -no abg needed -NPO for now, likely clears this afternoon -sitter to bedside -to [facility] in am I have contacted them already -no role bicarb at this time -pcxr without aspiration, no role abx -psych  consult needed -obtain acet, sal levels, lactic  To [facility] in am  Ccm time 30 min   Mcarthur Rossetti. Tyson Alias, MD, FACP Pgr: [phone] [facility]  Pulmonary and Critical Care Medicine [facility] Pager: [phone]  Mar 11, 2015, 6:56 AM

## 2015-03-10 NOTE — ED Notes (Signed)
INTUBATION IN PROGRESS

## 2015-03-10 NOTE — ED Notes (Signed)
Patient began hallucinating and becoming very agitated. Mumbling incoherently, swinging at nothing, until she crawled to her knees and had to be restrained from jumping over the siderails. Given 1 mg of Ativan IV, patient now snoring, ED doc at bedside during event, ICU team notified.

## 2015-03-10 NOTE — ED Notes (Signed)
Patient transported to CT 

## 2015-03-10 NOTE — Progress Notes (Signed)
RT responded to code when called by 3M01 RN. RT manually bagged pt until pulses returned and then put patient back on the ventilator. When the ventilator was not able to keep patients sat up RT manually bagged patient with a peep valve set at 10 until Dr Craige Cotta told RT to connect patient back to the vent. Pt remained on the ventilator until time of death.

## 2015-03-10 NOTE — Progress Notes (Signed)
Called to room with pt in cardiac arrest.  She had several episodes of cardiac arrest with bradycardia and PEA.  She received multiple doses of epi, atropine, HCO3.  Unable to improve oxygenation.  Remained hypotensive in spite of being on 50 of levophed, 300 phenylephrine, 0.03 vasopressin.  In d/w neurology there was concern for significant brain injury based on neuro exam prior to cardiac arrest.  I d/w pt's family.  Decision made to continue current therapies, not escalate care, and not perform additional CPR if she develops cardiac arrest again.  DNR order placed.  CC time 64 minutes.  Coralyn Helling, MD [facility] March 18, 2015, 6:33 PM Pager:  [phone] After 3pm call: [phone]

## 2015-03-10 NOTE — Progress Notes (Signed)
   2015/03/02 1500  Clinical Encounter Type  Visited With Family  Visit Type Initial;Psychological support;Spiritual support;Critical Care  Referral From Nurse  Consult/Referral To Chaplain  Spiritual Encounters  Spiritual Needs Emotional;Grief support;Prayer;Other (Comment) (Pastoral Support/Conversation)  Stress Factors  Patient Stress Factors Not reviewed  Family Stress Factors Loss;Other (Comment)   I was referred to this patient's family by the nurse who stated that the patient was being transferred to [facility].  I met with the family while they were waiting for the staff to get the patient ready to be transferred. The family requested prayer. The mother and father were both extremely tearful and showed appropriate grief over the condition of their daughter. They stated that they have a son who they are going to have to tell about the patient's condition.  I prayed with the mother, father and grandparents. There was a friend present as well, but he refused to pray with the family, even upon request from the father. The patient's friend seemed angry and didn't speak during the visit. I walked the family out and let them know that we have Chaplains at [facility] who are there to help if they need it.  I called over to the Spiritual Care Department at [facility] and let the director of our department know that the patient would be there and that the family might need extra support from one of our residents.   [place] M.Div.

## 2015-03-10 NOTE — Progress Notes (Signed)
   March 15, 2015 1836  Clinical Encounter Type  Visited With Patient and family together;Health care provider  Visit Type Follow-up;Code;Critical Care;Patient actively dying  Referral From Nurse   Chaplain responded and offered support intermittently while patient experienced cardiac arrest. Chaplain helped facilitate medical consultation. Chaplain services available as needed.   Alda Ponder, Chaplain Mar 15, 2015 6:37 PM

## 2015-03-10 NOTE — Progress Notes (Signed)
Patient transported to room 3M01 from [facility] by carelink. RT will continue to monitor.

## 2015-03-10 NOTE — ED Notes (Signed)
CRITICAL CARE PA PRESENT TO EVALUATE THIS PT. Pearson Grippe RN PRESENT TO ASSIST

## 2015-03-10 NOTE — ED Notes (Addendum)
Poison control contacted. Yolanda Phelps from poison control states to be aware of delayed effects such as tachycardia, seizures, and conduction delays with QRS complex. Symptoms may still be present 10-14 hours after ingestion. Cardiac monitor required for 24 hours with serial EKGs every 4-6 hours.   For seizure activity, use benzos of providers choice.  If QRS is 0.12 or greater, use bicarb boluses of 1-2 meQ per kg (drip is not sufficient).

## 2015-03-10 NOTE — Code Documentation (Signed)
  Patient Name: Yolanda Phelps   MRN: [id number]   Date of Birth/ Sex: 22-Jun-1995 , female      Admission Date: 10-Mar-2015  Attending Provider: Nelda Bucks, MD  Primary Diagnosis: <principal problem not specified>   Indication: Pt was in her usual state of health until this PM, when she was noted to became pulseless as went into Ventricular tachycardia. Code blue was subsequently called. At the time of arrival on scene, ACLS protocol was underway.   Technical Description:  - CPR performance duration:  5  minutes  - Was defibrillation or cardioversion used? Yes   - Was external pacer placed? Yes  - Was patient intubated pre/post CPR? Already intubated    Medications Administered: Y = Yes; Blank = No Amiodarone  1x  Atropine    Calcium    Epinephrine  2x  Lidocaine    Magnesium    Norepinephrine    Phenylephrine    Sodium bicarbonate    Vasopressin     Post CPR evaluation:  - Final Status - Was patient successfully resuscitated ? Yes - What is current rhythm? Sinus tachycardia  - What is current hemodynamic status? 90/50  Miscellaneous Information:  - Labs sent, including: CMET, CBC, magnesium   - Primary team notified?  Yes  - Family Notified? Yes  - Additional notes/ transfer status:  Dr. Craige Cotta subsequently arrived and took over care of patient.      Mahnoor Mathisen Mayra Reel, MD  Mar 10, 2015, 5:47 PM

## 2015-03-10 NOTE — ED Notes (Addendum)
Patient's clothing, cell phone, and earrings given to her father.

## 2015-03-10 NOTE — Progress Notes (Signed)
Pt arrived to the unit intubated.1 amp of sodium bicarb administered upon arrival to unit. Upon assessment, pt was completely unresponsive. Pupils were fixed and nonreactive bilaterally. Pt was hypotensive at 59/35, HR 98.  NP made aware and neo was ordered and started. Pt did have three witnessed seizures with NP at bedside. Versed gtt ordered and started at . Rectal temperature result was 94.9 and bair hugger was applied. Recheck of CBG was 136 (pt was hypoglycemic at 58 in ED with amp of D50 given). Sodium bicarb gtt ordered and started at 100cc/hr. EKG done which resulted in Sinus Tachycardia with a QTc level of 530 to which another amp of sodium bicarb was ordered and administered. One dose of keppra given. Temp. Foley was placed with 375cc of clear, yellow urine returned. Poison control center was notified and family was present in waiting room. Central line placed with chest x-ray conformation. Pt transferred to [facility] at 1430. Report called to RN on unit. Vital Signs at 1400: BP 100/71(80), HR 94, Temp 95.9 per temp foley, RR 24. O2 97% on 30% Fio2 per ventilator.

## 2015-03-10 NOTE — Plan of Care (Cosign Needed)
POISON CONTROL UPDATE @ 1330  1. EKG Q1hr for QRS and QTc Monitoring. Call PCCM with EKG results.   2. If QRS remains widened and QTc continues to prolongate, Bicarb bolus q1 hour until QRS WNL  3. Do not use paralytics due to masking of seizure activity  4. In the event of cardiac arrest, intralipid should be administered  5. If Na > 160, or cardiac arrest with intralipid administration, call back to poison control and speak with toxicologist  6. No place for dialysis.   Yolanda Mccroskey PA-S  PCCM  2015-03-04 1419

## 2015-03-10 NOTE — ED Provider Notes (Signed)
Called to bedside for agitation, bizarre behavior.  Pt noted to be looking around the room, pupils dilated bilaterally.  Appears to be hallucinating, agitated, trying to crawl out of bed.  Treated with additional ativan.  Critical updated of change in status.    Tilden Fossa, MD 03-02-2015 [phone]

## 2015-03-10 NOTE — ED Notes (Signed)
E LINK CALLED PER EDP REES

## 2015-03-10 NOTE — ED Notes (Signed)
E LINK REMAINS PRESENT ASSISTING WITH MONITORING AND EVALUATING THIS PT

## 2015-03-10 NOTE — Progress Notes (Signed)
RT Report called and given to [facility] RT.

## 2015-03-10 NOTE — ED Notes (Signed)
cbg 88 in ed

## 2015-03-10 NOTE — Progress Notes (Signed)
Bedside EEG completed.  Dr Hosie Poisson reviewed EEG while in progress.  Results pending.

## 2015-03-10 NOTE — ED Provider Notes (Signed)
Called to bedside for seizure. Patient with generalized seizure activity, head deviated to the left lasting less than 1 minute. She was given 2 mg of Ativan. No vomiting, no hypoxia.  ICU updated of change in patient status.  Tilden Fossa, MD 02-23-2015 [phone]

## 2015-03-10 NOTE — ED Notes (Signed)
CRITICAL CARE MD AVA PRESENT TO EVALUATE THIS PT

## 2015-03-10 NOTE — Progress Notes (Signed)
PULMONARY / CRITICAL CARE MEDICINE   Name: Yolanda Phelps MRN: [id number] DOB: 01/06/1996    ADMISSION DATE:  07-Mar-2015 CONSULTATION DATE:  2015/03/07  REFERRING MD:  Dr. Nicanor Alcon   CHIEF COMPLAINT:  Overdose, seizure/AMS   SUBJECTIVE:  RN reports seizure around 0815 - tonic clonic, received total 3 mg ativan then became more somnolent    VITAL SIGNS: BP 86/59 mmHg  Pulse 110  Temp(Src) 97.6 F (36.4 C) (Axillary)  Resp 16  Ht  (1.499 m)  SpO2 100%  LMP 01/17/2015 (Approximate)  HEMODYNAMICS:    VENTILATOR SETTINGS: Vent Mode:  [-] PRVC FiO2 (%):  [50 %-100 %] 50 % Set Rate:  [16 bmp] 16 bmp Vt Set:  [350 mL] 350 mL PEEP:  [5 cmH20] 5 cmH20 Plateau Pressure:  [15 cmH20] 15 cmH20  INTAKE / OUTPUT:    PHYSICAL EXAMINATION: General:  Young adult female lying on stretcher Neuro:  Obtunded, pupils 5mm, =/reactive, localizes to sternal rub HEENT:  MM pink/moist, no jvd Cardiovascular:  s1s2 rrr, no m/r/g Lungs: shallow/non-labored, lungs bilaterally clear Abdomen:  Soft, non-distended  Musculoskeletal:  No acute deformities  Skin:  Warm/dry, no edema   LABS:  BMET  Recent Labs Lab 03/07/2015 0520 07-Mar-2015 0755  NA 141  --   K 5.0  --   CL 104  --   BUN 8  --   CREATININE 0.70 0.70  GLUCOSE 75  --     Electrolytes No results for input(s): CALCIUM, MG, PHOS in the last 168 hours.  CBC  Recent Labs Lab Mar 07, 2015 0512 2015/03/07 0520 03/07/15 0755  WBC 10.4  --  9.0  HGB 14.5 16.7* 13.6  HCT 46.5* 49.0* 42.3  PLT 284  --  251    Coag's  Recent Labs Lab 03/07/2015 0755  APTT 31  INR 1.04    Sepsis Markers  Recent Labs Lab 03-07-15 0755  LATICACIDVEN 1.3    ABG No results for input(s): PHART, PCO2ART, PO2ART in the last 168 hours.  Liver Enzymes  Recent Labs Lab Mar 07, 2015 0809  AST 15  ALT 11*  ALKPHOS 55  BILITOT 0.5  ALBUMIN 3.9    Cardiac Enzymes No results for input(s): TROPONINI, PROBNP in the last 168  hours.  Glucose  Recent Labs Lab 07-Mar-2015 0751 03-07-2015 0958  GLUCAP 80 88    Imaging Dg Chest Portable 1 View  March 07, 2015  CLINICAL DATA:  Overdose last night. Occasional smoker. Immune deficiency disorder. EXAM: PORTABLE CHEST 1 VIEW COMPARISON:  Multiple exams, including 01/16/2010 FINDINGS: The heart size and mediastinal contours are within normal limits. Both lungs are clear. The visualized skeletal structures are unremarkable. IMPRESSION: No active disease. Electronically Signed   By: Gaylyn Rong M.D.   On: 03-07-15 06:40     STUDIES:  2/10  CT Head >> no significant abnormality   CULTURES:   ANTIBIOTICS:   SIGNIFICANT EVENTS: 2/10  Admit with overdose, mental status decompensated post seizure and required intubation  LINES/TUBES: ETT 2/10 >>   DISCUSSION: 20 y/o F with PMH of prior suicide attempt admitted 2/10 with polysubstance overdose (wellbutrin, xanax, etoh).  Later developed seizure and altered mental status / somnolence requiring intubation.    ASSESSMENT / PLAN:  NEUROLOGIC A:   Polysubstance Overdose - unclear if intentional OD.  Patient told the staff she "just didn't want to feel pain anymore". She also apparently was posting on social media about her overdose.  CT head negative.   Hx Prior Suicide Attempt  At Risk Seizure P:   RASS goal: 0 PRN fentanyl / versed for pain & sedation/seizures Serial neuro exams Daily WUA / SBT  Seizure precautions Will need PSY evaluation post extubation  Suicide precautions  PULMONARY A: Intubated for Airway Protection - secondary to polysubstance overdose  P:   Now intubation for airway protection  PRVC 8 cc/kg  Wean PEEP / FiO2 for saturations  ABG in one hour Follow up CXR for ETT placement   CARDIOVASCULAR A:  Mild Hypotension - thought to be sedation related + overdose At Risk Prolonged QTc P:  Monitor hemodynamics in ICU S/P 4L NS bolus NS @ 125 ml/hr, consider rate reduction in am   May require vasopressors  EKG Q4 for now  RENAL  A:  Metabolic Acidosis - secondary to overdose, lactate negative  P:  Trend BMP / UOP  Replace electrolytes as indicated  Add Bicarbonate gtt in dextrose  GASTROINTESTINAL A:   At Risk Aspiration S/P Activated Charcoal  P:   NPO  OGT to LIS  PPI   HEMATOLOGIC A:   DVT prophylaxis  P:  Monitor CBC  Heparin for DVT prophylaxis  INFECTIOUS A:   At Risk For Aspiration - initial film negative for airspace disease  P:   Monitor CXR, fever curve, WBC  ENDOCRINE A:   Hypoglycemia - NPO status, overdose  P:   Monitor CBG Q4 Dextrose in bicarb as above   FAMILY  - Updates:  Family updated per Dr. Vassie Loll in ER.    - Inter-disciplinary family meet or Palliative Care meeting due by:  2/17   [name], NP-C [facility] Pgr: [phone] or if no answer [phone] Mar 13, 2015, 11:12 AM

## 2015-03-10 NOTE — Progress Notes (Signed)
Versed 2 mg/2 ml  From ED wasted in sink and witnessed by Oneita Hurt, RN and Rosana Fret, RN.

## 2015-03-10 NOTE — Progress Notes (Signed)
   03-20-15 1544  Clinical Encounter Type  Visited With Family;Health care provider  Visit Type Initial;Critical Care  Referral From Chaplain;Nurse  Spiritual Encounters  Spiritual Needs Emotional  Stress Factors  Family Stress Factors Lack of knowledge   Chaplain responded to a request from [facility] chaplain to assist and support patient's family. Chaplain assisted with family transitioning to the unit. Chaplain also delivered patient's prayer shawl from the family to the patient while they're waiting through the EEG. Chaplain will seek to follow-up, and our services are available as needed.   Alda Ponder, Chaplain 03-20-2015 3:46 PM

## 2015-03-10 NOTE — ED Notes (Signed)
Pt states she took Xanax 1 tab strength unknown, Wellbutrin XL 150 mg 30 tabs about an hour ago, Topamax  4 tabs and earlier tonight she took ibuprofen 2 tabs for a headache  Pt states she has also had 4 shots of vodka earlier tonight  Pt is alert and oriented x 3  Skin warm and dry  Pt states she was not trying to kill herself  Pt states she had a suicide attempt when she was in the 9th grade but not since  Pt has hx of depression  Pt states all medication was hers except for the xanax

## 2015-03-10 NOTE — ED Notes (Signed)
ICU team here to evaluate patient. Pt has no gag reflux or cough reflux. Will be intubating patient soon. Family to be notified.

## 2015-03-10 NOTE — Progress Notes (Signed)
   02-27-2015 1706  Clinical Encounter Type  Visited With Family;Health care provider  Visit Type Follow-up;Critical Care  Referral From Family;Nurse  Spiritual Encounters  Spiritual Needs Emotional  Stress Factors  Family Stress Factors Health changes;Lack of knowledge   Chaplain followed up with patient. Chaplain helped facilitate and participate in medical consultation. Chaplain was paged away, but will seek to follow up and our support is available as needed.   Alda Ponder, Chaplain 02/27/2015 5:08 PM

## 2015-03-10 NOTE — Consult Note (Signed)
NEURO HOSPITALIST CONSULT NOTE   Requestig physician: Dr. Celestia Khat   Reason for Consult: suicide attempt with medications and possible seizure.   HPI:                                                                                                                                          Yolanda Phelps is an 20 y.o. female Pt h/o of suicide attempt in 9th grade. she took Xanax 1 tab strength unknown, Wellbutrin XL 150 mg 30 tabs about an hour ago, Topamax  4 tabs and earlier tonight she took ibuprofen 2 tabs for a headache Pt states she has also had 4 shots of vodka earlier tonight denies trying suicide. Pt has hx of depression. Pt states all medication was hers except for the xanax. patient was awake and alert on arrival but quickly deteriorated and today showing decorticate posturing and UE jerking with upward gaze. Neurology was called --currently not breathing over vent, has no pupillary, dolls, corneal or gag. Not breathing over vent. Pupils 6 mm fixed and dilated. On no paralytics.   Past Medical History  Diagnosis Date  . Childhood asthma   . Migraines   . Depression   . Immune deficiency disorder (HCC)     pt not with much info avail, says brother has same defect but is worse and has to have IgG infusions  . ADHD (attention deficit hyperactivity disorder)     Past Surgical History  Procedure Laterality Date  . Adenoidectomy  1997  . Wisdom tooth extraction  2014  . Myringotomy      Family History  Problem Relation Age of Onset  . Hyperlipidemia Other     parent  . Hypertension Mother   . Hypertension Father   . Depression Mother   . Depression Maternal Grandmother   . Depression Paternal Grandmother   . Diabetes Mother   . Arthritis Paternal Grandmother   . Breast cancer Maternal Grandmother   . Breast cancer Paternal Grandmother   . Lung cancer Maternal Grandmother   . Hyperlipidemia Other     grandparent  . Heart disease Other      grandparent  . Diabetes Maternal Grandmother   . Autoimmune disease Brother   . Asthma Brother      Social History:  reports that she has been smoking.  She has never used smokeless tobacco. She reports that she drinks alcohol. She reports that she does not use illicit drugs.  No Known Allergies  MEDICATIONS:  Prior to Admission:  Prescriptions prior to admission  Medication Sig Dispense Refill Last Dose  . azelastine (ASTELIN) 0.1 % nasal spray Place 2 sprays into both nostrils 2 (two) times daily. Use in each nostril as directed 30 mL 12 Past Week at Unknown time  . buPROPion (WELLBUTRIN XL) 150 MG 24 hr tablet Take 3 tablets (450 mg total) by mouth daily. 270 tablet 3 02/18/2015 at Unknown time  . ibuprofen (ADVIL,MOTRIN) 200 MG tablet Take 400 mg by mouth every 6 (six) hours as needed for moderate pain.   02/18/2015 at Unknown time  . levocetirizine (XYZAL) 5 MG tablet Take 1 tablet by mouth daily.  6 02/18/2015 at Unknown time  . lisdexamfetamine (VYVANSE) 40 MG capsule Take 1 capsule (40 mg total) by mouth every morning. 30 capsule 0 02/18/2015 at Unknown time  . montelukast (SINGULAIR) 10 MG tablet Take 1 tablet by mouth daily.  6 02/18/2015 at Unknown time  . norethindrone-ethinyl estradiol (MICROGESTIN,JUNEL,LOESTRIN) 1-20 MG-MCG tablet Take by mouth.   02/18/2015 at Unknown time  . topiramate (TOPAMAX) 100 MG tablet Take 0.5 tablets (50 mg total) by mouth daily. (Patient taking differently: Take 100 mg by mouth daily. ) 30 tablet 1 02/18/2015 at 2000   Scheduled: . antiseptic oral rinse  7 mL Mouth Rinse QID  . chlorhexidine gluconate  15 mL Mouth Rinse BID  . dextrose      . famotidine  20 mg Oral BID  . fentaNYL      . folic acid  1 mg Oral Daily  . heparin  5,000 Units Subcutaneous 3 times per day  . insulin aspart  0-9 Units Subcutaneous 6 times per day  . levETIRAcetam   1,000 mg Intravenous Once  . levETIRAcetam  500 mg Intravenous Q12H  . midazolam      . multivitamin  1 tablet Oral Daily  . pantoprazole (PROTONIX) IV  40 mg Intravenous Q24H  . sodium bicarbonate      . thiamine  100 mg Oral Daily     ROS:                                                                                                                                       History obtained from unobtainable from patient due to mental status     Blood pressure 80/55, pulse 97, temperature 97.8 F (36.6 C), temperature source Axillary, resp. rate 19, height 4\' 11"  (1.499 m), weight 72.2 kg (159 lb 2.8 oz), last menstrual period 01/17/2015, SpO2 92 %.   Neurologic Examination:  HEENT-  Normocephalic, no lesions, without obvious abnormality.  Normal external eye and conjunctiva.  Normal TM's bilaterally.  Normal auditory canals and external ears. Normal external nose, mucus membranes and septum.  Normal pharynx. Cardiovascular- S1, S2 normal, pulses palpable throughout   Lungs- chest clear, no wheezing, rales, normal symmetric air entry Abdomen- normal findings: bowel sounds normal Extremities- no edema Lymph-no adenopathy palpable Musculoskeletal-no joint tenderness, deformity or swelling Skin-warm and dry, no hyperpigmentation, vitiligo, or suspicious lesions  Neurological Examination Mental Status: Patient does not respond to verbal stimuli.  Does not respond to deep sternal rub.  Does not follow commands.  No verbalizations noted. Intubated and not breathing over vent.  Cranial Nerves: II: patient does not respond confrontation bilaterally, pupils right 6 mm, left 6 mm,and non-reactive bilaterally III,IV,VI: doll's response absent bilaterally.  V,VII: corneal reflex absent bilaterally  VIII: patient does not respond to verbal stimuli IX,X: gag reflex absent, XI: trapezius strength  unable to test bilaterally XII: tongue strength unable to test Motor: Extremities flaccid throughout.  No spontaneous movement noted.  No purposeful movements noted. Sensory: Does not respond to noxious stimuli in any extremity. Deep Tendon Reflexes:  Absent throughout. Plantars: absent bilaterally Cerebellar: Unable to perform        Lab Results: Basic Metabolic Panel:  Recent Labs Lab Mar 05, 2015 0520 05-Mar-2015 0755 March 05, 2015 0809  NA 141  --   --   K 5.0  --   --   CL 104  --   --   GLUCOSE 75  --   --   BUN 8  --   --   CREATININE 0.70 0.70  --   MG  --   --  2.0    Liver Function Tests:  Recent Labs Lab 05-Mar-2015 0809  AST 15  ALT 11*  ALKPHOS 55  BILITOT 0.5  PROT 6.5  ALBUMIN 3.9    Recent Labs Lab Mar 05, 2015 0755  LIPASE 24  AMYLASE 60   No results for input(s): AMMONIA in the last 168 hours.  CBC:  Recent Labs Lab 03-05-2015 0512 2015-03-05 0520 Mar 05, 2015 0755  WBC 10.4  --  9.0  NEUTROABS 6.0  --   --   HGB 14.5 16.7* 13.6  HCT 46.5* 49.0* 42.3  MCV 93.0  --  93.0  PLT 284  --  251    Cardiac Enzymes: No results for input(s): CKTOTAL, CKMB, CKMBINDEX, TROPONINI in the last 168 hours.  Lipid Panel: No results for input(s): CHOL, TRIG, HDL, CHOLHDL, VLDL, LDLCALC in the last 168 hours.  CBG:  Recent Labs Lab 03-05-15 0751 05-Mar-2015 0958 2015/03/05 1159 03/05/15 1255  GLUCAP 80 88 58* 136*    Microbiology: No results found for this or any previous visit.  Coagulation Studies:  Recent Labs  2015-03-05 0755  LABPROT 13.4  INR 1.04    Imaging: Ct Head Wo Contrast  March 05, 2015  CLINICAL DATA:  Increased agitation and confusion.  Overdose. EXAM: CT HEAD WITHOUT CONTRAST TECHNIQUE: Contiguous axial images were obtained from the base of the skull through the vertex without intravenous contrast. COMPARISON:  11/06/2013 FINDINGS: The brainstem, cerebellum, cerebral peduncles, thalami, basal ganglia, basilar cisterns, and ventricular  system appear within normal limits. No intracranial hemorrhage, mass lesion, or acute CVA. IMPRESSION: 1.  No significant abnormality identified. Electronically Signed   By: Gaylyn Rong M.D.   On: 03-05-15 11:26   Dg Chest Portable 1 View  2015/03/05  CLINICAL DATA:  Overdose, endotracheal tube placement. EXAM: PORTABLE CHEST 1  VIEW COMPARISON:  None. FINDINGS: Endotracheal tube projects over the tracheal air shadow with tip 2.1 cm above the carina, satisfactorily positioned. Nasogastric tube enters the stomach. The patient is rotated to the right on today's radiograph, reducing diagnostic sensitivity and specificity. The lungs ring clear. Heart size within normal limits. No pleural effusion or pneumothorax. IMPRESSION: 1. Endotracheal tube satisfactorily positioned with tip 2.1 cm above the carina. Electronically Signed   By: Gaylyn Rong M.D.   On: 02/24/15 11:13   Dg Chest Portable 1 View  02-24-15  CLINICAL DATA:  Overdose last night. Occasional smoker. Immune deficiency disorder. EXAM: PORTABLE CHEST 1 VIEW COMPARISON:  Multiple exams, including 01/16/2010 FINDINGS: The heart size and mediastinal contours are within normal limits. Both lungs are clear. The visualized skeletal structures are unremarkable. IMPRESSION: No active disease. Electronically Signed   By: Gaylyn Rong M.D.   On: 2015-02-24 06:40       Assessment and plan per attending neurologist  Felicie Morn PA-C [facility] [phone]  February 24, 2015, 1:33 PM   Assessment/Plan:  20 YO female presenting after suicide attempt by ingestion of multiple pills including Xanax 1 tab strength unknown, Wellbutrin XL 150 mg 30 tabs, Topamax 100mg  4 tabs, ibuprofen, and ETOH. Current exam shows no intact cranial nerves. EEG reviewed and findings concerning for possible burst suppression pattern.   Had family meeting to discuss above findings. Counseled them that EEG and exam findings raise concern for  possible anoxic brain injury. Counseled them that we will continue to monitor, aim to hold all sedation and reassess in the morning. Will plan for repeat head CT in the morning. 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 Will continue to monitor.  Elspeth Cho, DO [facility] [phone]  If 7pm- 7am, please page neurology on call as listed in AMION.

## 2015-03-10 DEATH — deceased

## 2016-04-14 IMAGING — DX DG CHEST 1V PORT
1 series · 1 of 1 positions shown · non-contrast
Comparison: None.

CLINICAL DATA: Overdose, endotracheal tube placement.

EXAM:
PORTABLE CHEST 1 VIEW

[chest ap]
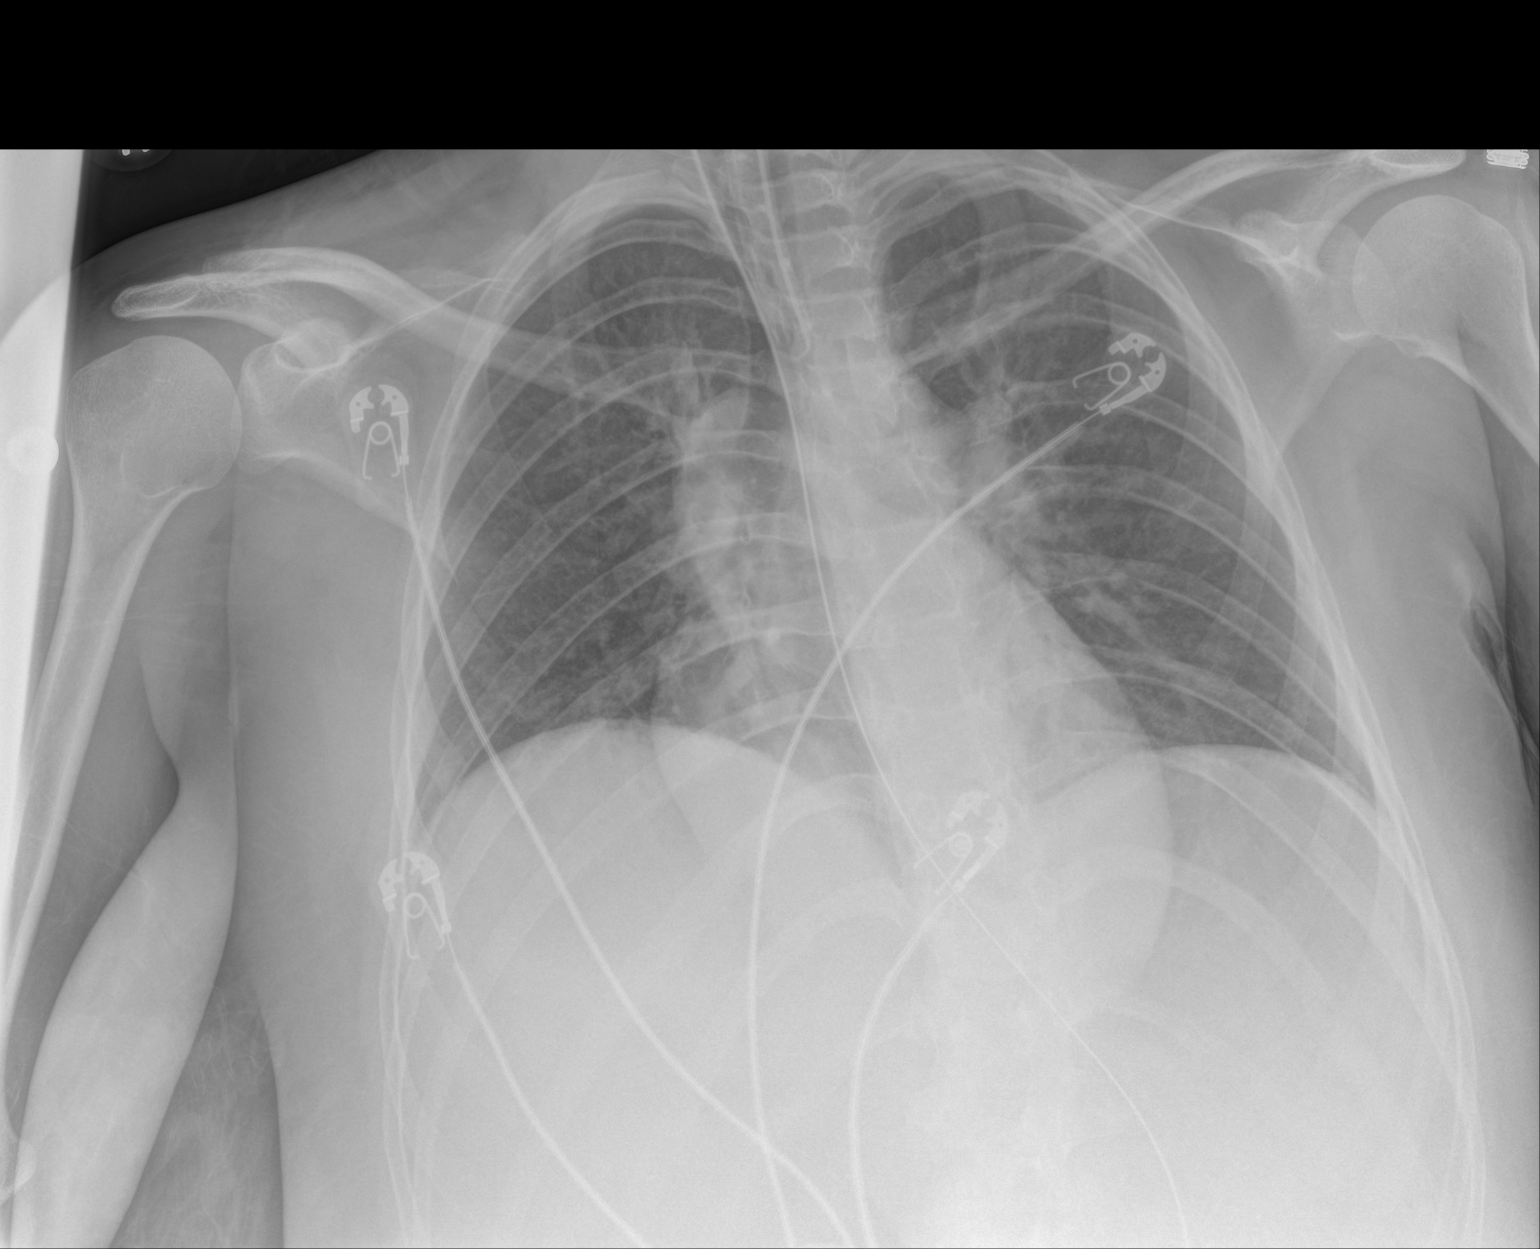

[1 of 1 positions shown; findings below may reference images not displayed]

FINDINGS: Endotracheal tube projects over the tracheal air shadow with tip
cm above the carina, satisfactorily positioned. Nasogastric tube
enters the stomach.

The patient is rotated to the right on today's radiograph, reducing
diagnostic sensitivity and specificity. The lungs ring clear. Heart
size within normal limits. No pleural effusion or pneumothorax.
IMPRESSION: 1. Endotracheal tube satisfactorily positioned with tip 2.1 cm above
the carina.

## 2016-04-14 IMAGING — CT CT HEAD W/O CM
2 series · 17 of 30 positions shown, 20 images · non-contrast
Comparison: 11/06/2013

CLINICAL DATA: Increased agitation and confusion.  Overdose.

EXAM:
CT HEAD WITHOUT CONTRAST
TECHNIQUE: Contiguous axial images were obtained from the base of the skull
through the vertex without intravenous contrast.

[Series 2: head w/o · axial · non-contrast · 0.45mm/px · z∈[-193,-83]mm · 9 of 28 slices shown, 12 images]
[im 3/28  brain]
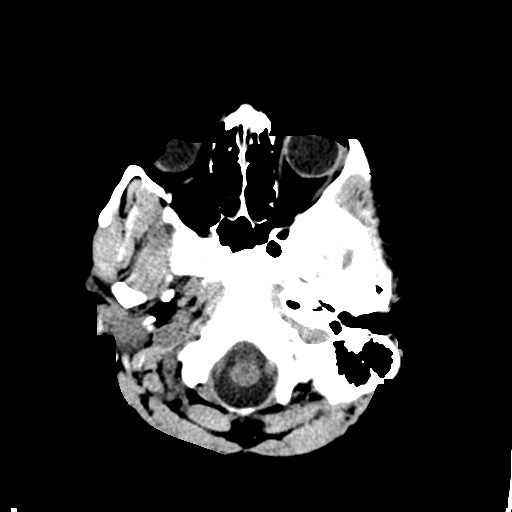
[im 3/28  bone]
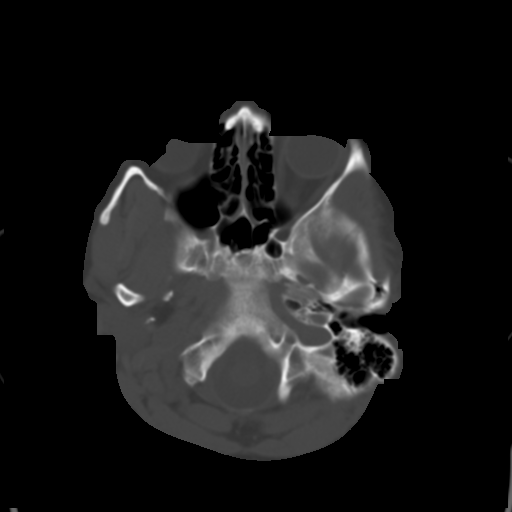
[im 6/28  brain]
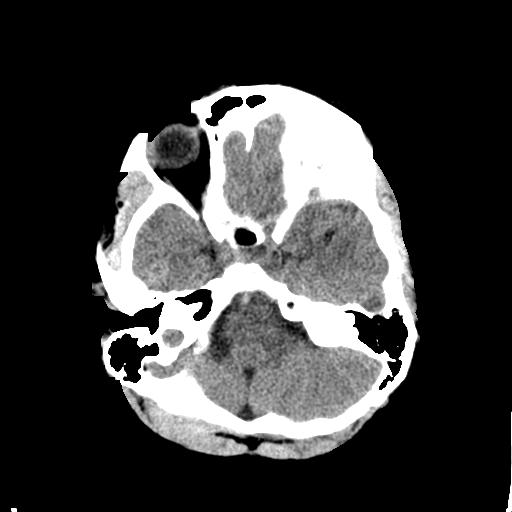
[im 9/28  brain]
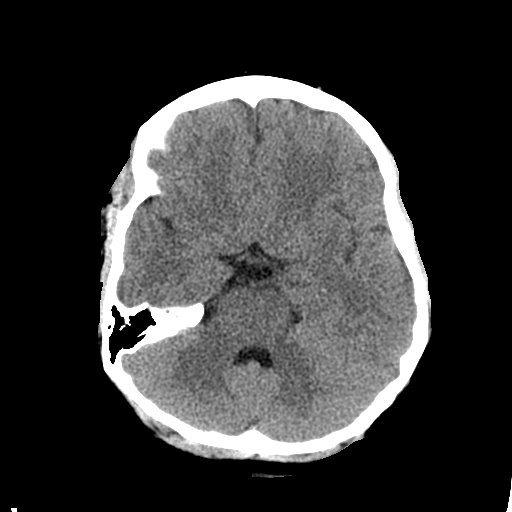
[im 11/28  brain]
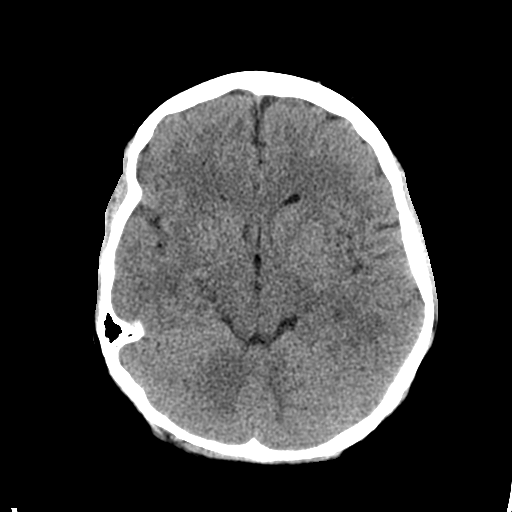
[im 14/28  brain]
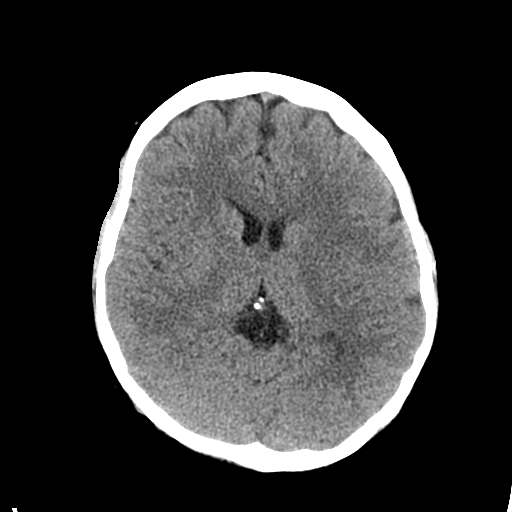
[im 14/28  bone]
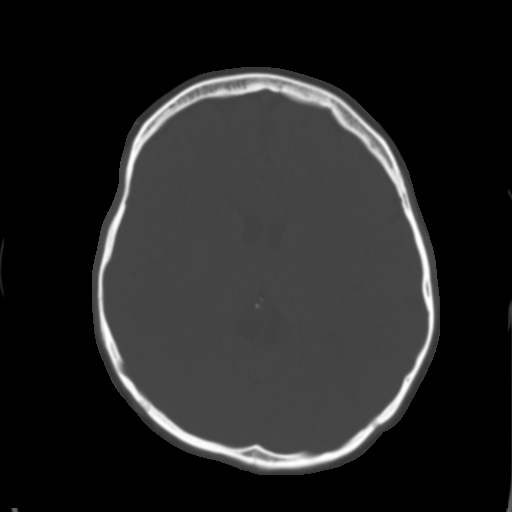
[im 17/28  brain]
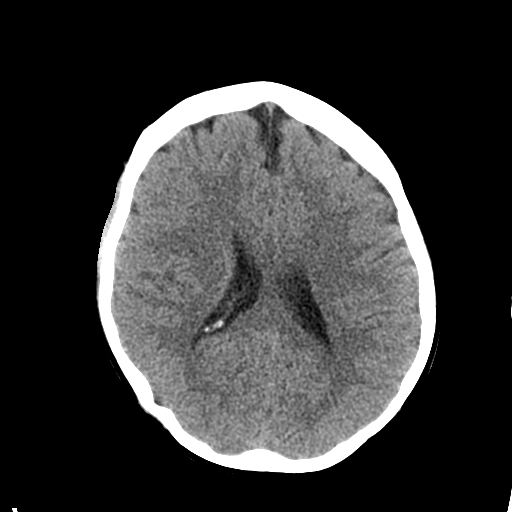
[im 19/28  brain]
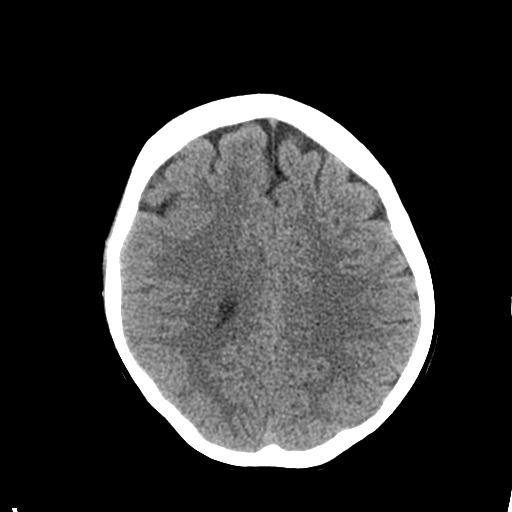
[im 22/28  brain]
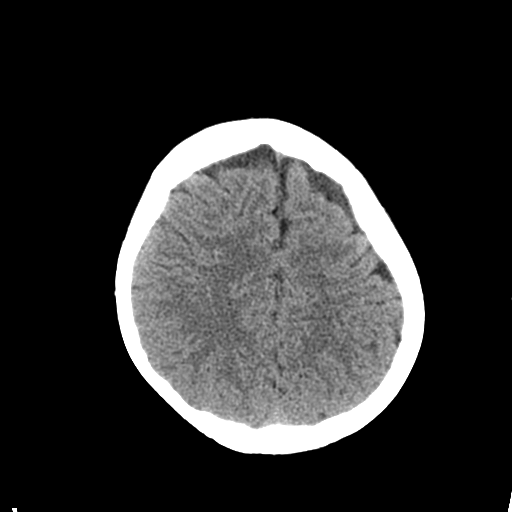
[im 25/28  brain]
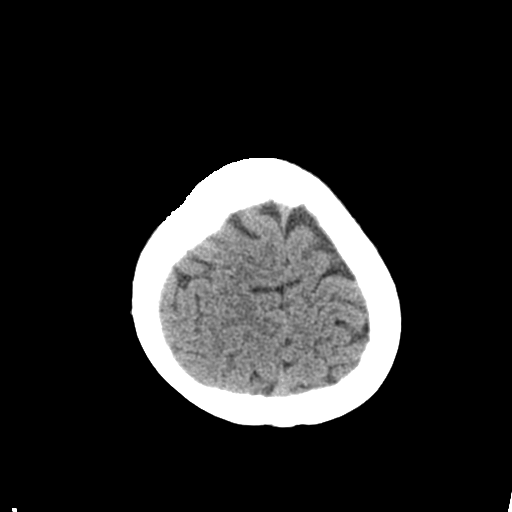
[im 25/28  bone]
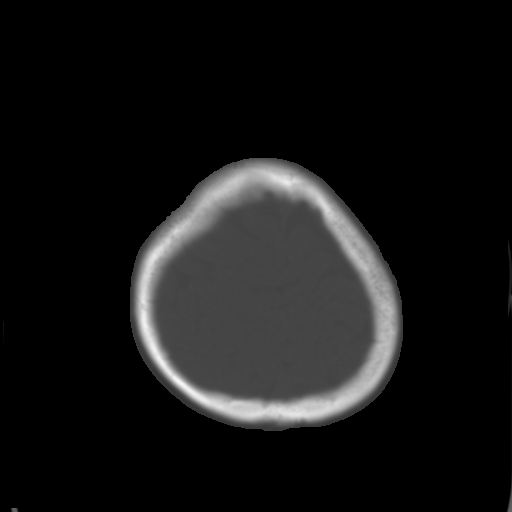

[Series 3: bone windows · axial · 0.45mm/px · z∈[-188,-83]mm · 8 of 46 slices shown]
[im 6/46  bone]
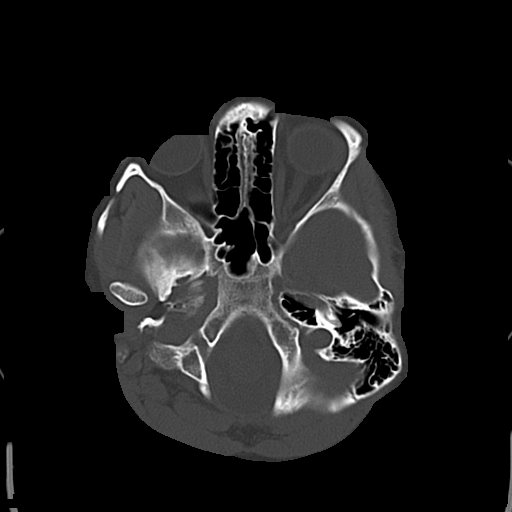
[im 11/46  bone]
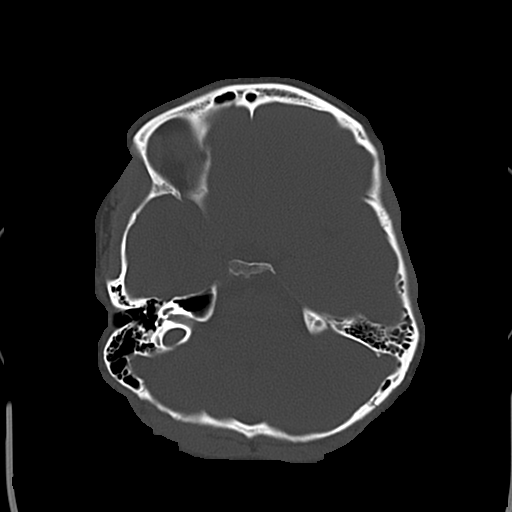
[im 16/46  bone]
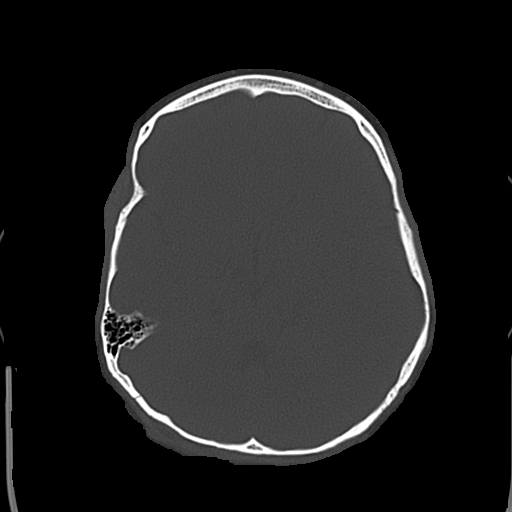
[im 21/46  bone]
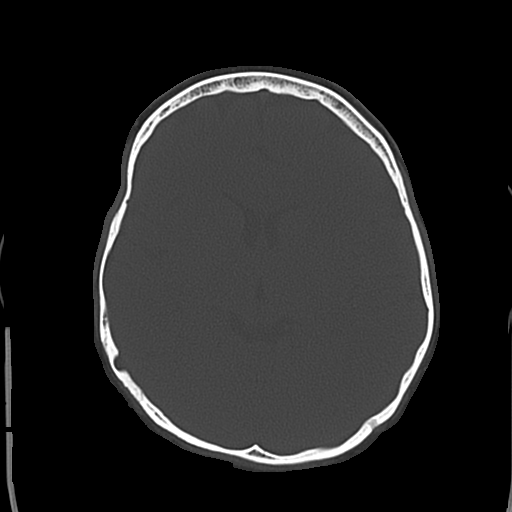
[im 26/46  bone]
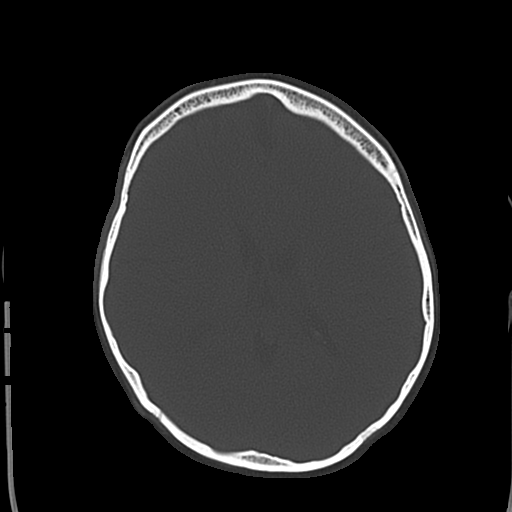
[im 31/46  bone]
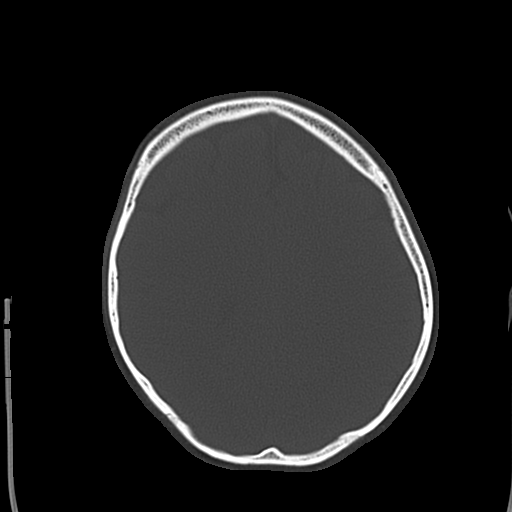
[im 36/46  bone]
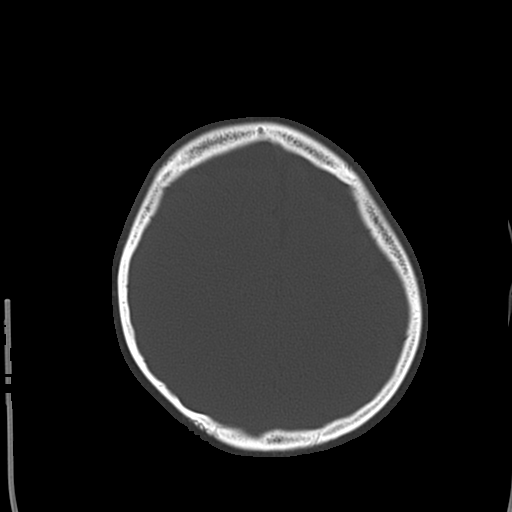
[im 41/46  bone]
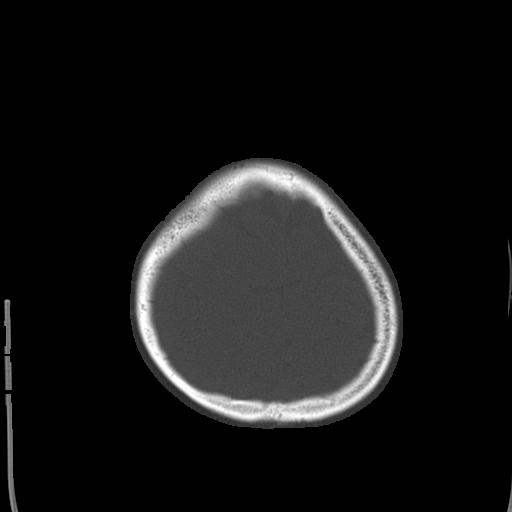

[17 of 30 positions shown; findings below may reference images not displayed]

FINDINGS: The brainstem, cerebellum, cerebral peduncles, thalami, basal
ganglia, basilar cisterns, and ventricular system appear within
normal limits. No intracranial hemorrhage, mass lesion, or acute
CVA.
IMPRESSION: 1.  No significant abnormality identified.

## 2016-04-14 IMAGING — DX DG CHEST 1V PORT
1 series · 1 of 1 positions shown · non-contrast
Comparison: Multiple exams, including 01/16/2010

CLINICAL DATA: Overdose last night. Occasional smoker. Immune
deficiency disorder.

EXAM:
PORTABLE CHEST 1 VIEW

[chest ap]
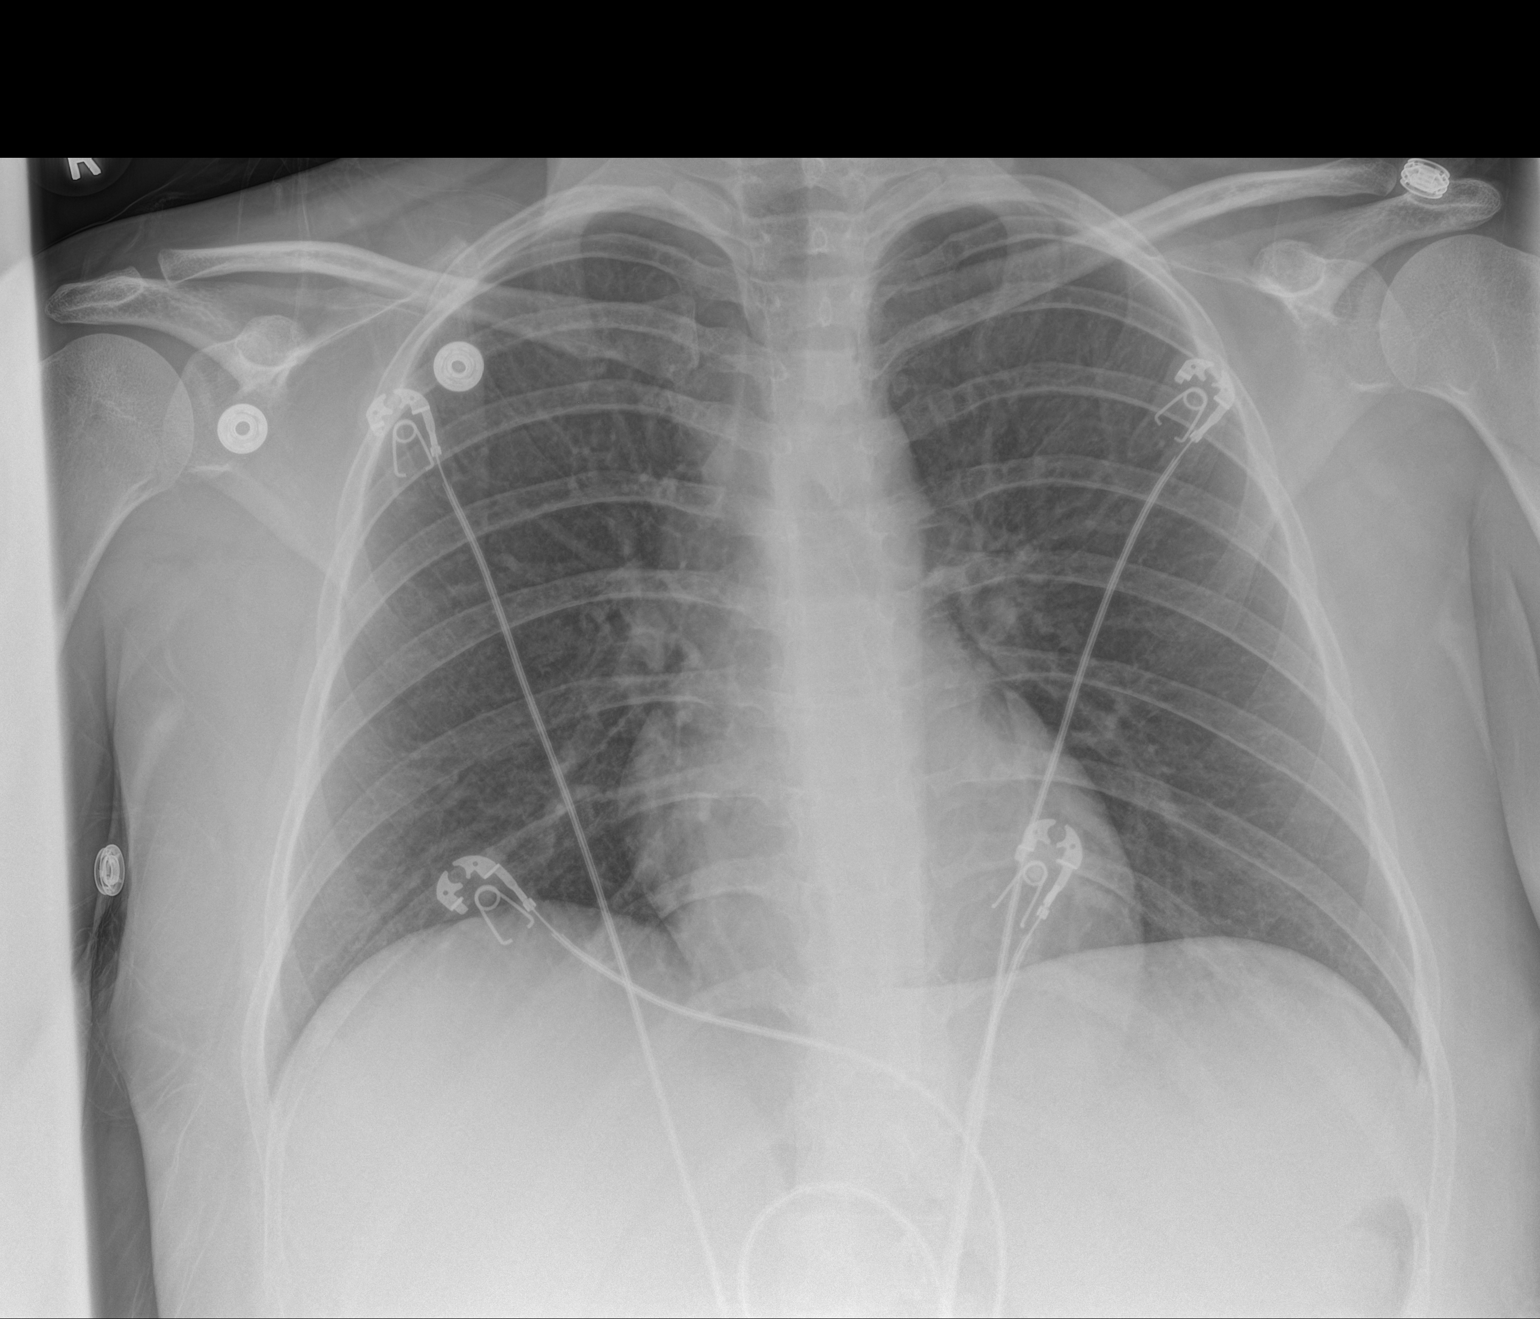

[1 of 1 positions shown; findings below may reference images not displayed]

FINDINGS: The heart size and mediastinal contours are within normal limits.
Both lungs are clear. The visualized skeletal structures are
unremarkable.
IMPRESSION: No active disease.
